# Patient Record
Sex: Female | Born: 1945
Health system: Southern US, Community
[De-identification: ages and names within clinical notes are randomized; demographics above are authoritative.]

## PROBLEM LIST (undated history)

## (undated) DIAGNOSIS — Z789 Other specified health status: Secondary | ICD-10-CM

## (undated) DIAGNOSIS — M810 Age-related osteoporosis without current pathological fracture: Secondary | ICD-10-CM

## (undated) DIAGNOSIS — Z603 Acculturation difficulty: Secondary | ICD-10-CM

## (undated) DIAGNOSIS — I10 Essential (primary) hypertension: Secondary | ICD-10-CM

## (undated) HISTORY — DX: Other specified health status: Z78.9

## (undated) HISTORY — DX: Essential (primary) hypertension: I10

## (undated) HISTORY — DX: Age-related osteoporosis without current pathological fracture: M81.0

## (undated) HISTORY — PX: COLONOSCOPY: SHX174

## (undated) HISTORY — DX: Acculturation difficulty: Z60.3

---

## 2005-03-18 ENCOUNTER — Encounter (INDEPENDENT_AMBULATORY_CARE_PROVIDER_SITE_OTHER): Payer: Self-pay | Admitting: *Deleted

## 2005-03-18 LAB — CONVERTED CEMR LAB

## 2005-03-22 ENCOUNTER — Ambulatory Visit: Payer: Self-pay | Admitting: Family Medicine

## 2005-04-05 ENCOUNTER — Encounter: Admission: RE | Admit: 2005-04-05 | Discharge: 2005-04-05 | Payer: Self-pay | Admitting: Sports Medicine

## 2005-05-03 ENCOUNTER — Ambulatory Visit (HOSPITAL_COMMUNITY): Admission: RE | Admit: 2005-05-03 | Discharge: 2005-05-03 | Payer: Self-pay | Admitting: Family Medicine

## 2005-05-03 ENCOUNTER — Ambulatory Visit: Payer: Self-pay | Admitting: Family Medicine

## 2005-08-02 ENCOUNTER — Inpatient Hospital Stay (HOSPITAL_COMMUNITY): Admission: RE | Admit: 2005-08-02 | Discharge: 2005-08-08 | Payer: Self-pay | Admitting: Psychiatry

## 2005-08-02 ENCOUNTER — Ambulatory Visit: Payer: Self-pay | Admitting: Family Medicine

## 2005-08-03 ENCOUNTER — Ambulatory Visit: Payer: Self-pay | Admitting: Psychiatry

## 2006-06-06 ENCOUNTER — Ambulatory Visit: Payer: Self-pay | Admitting: Family Medicine

## 2006-12-19 ENCOUNTER — Ambulatory Visit: Payer: Self-pay | Admitting: Family Medicine

## 2006-12-19 ENCOUNTER — Encounter: Payer: Self-pay | Admitting: Family Medicine

## 2006-12-29 ENCOUNTER — Ambulatory Visit: Payer: Self-pay | Admitting: Family Medicine

## 2007-01-15 DIAGNOSIS — I10 Essential (primary) hypertension: Secondary | ICD-10-CM

## 2007-01-15 DIAGNOSIS — E7800 Pure hypercholesterolemia, unspecified: Secondary | ICD-10-CM | POA: Insufficient documentation

## 2007-01-16 ENCOUNTER — Encounter (INDEPENDENT_AMBULATORY_CARE_PROVIDER_SITE_OTHER): Payer: Self-pay | Admitting: *Deleted

## 2007-06-08 ENCOUNTER — Ambulatory Visit: Payer: Self-pay | Admitting: Family Medicine

## 2007-06-19 ENCOUNTER — Encounter (INDEPENDENT_AMBULATORY_CARE_PROVIDER_SITE_OTHER): Payer: Self-pay | Admitting: Family Medicine

## 2007-06-19 ENCOUNTER — Encounter: Admission: RE | Admit: 2007-06-19 | Discharge: 2007-06-19 | Payer: Self-pay | Admitting: Family Medicine

## 2007-06-19 ENCOUNTER — Ambulatory Visit: Payer: Self-pay | Admitting: Family Medicine

## 2007-06-19 LAB — CONVERTED CEMR LAB
Basophils Absolute: 0 10*3/uL (ref 0.0–0.1)
Eosinophils Relative: 1 % (ref 0–5)
HCT: 42.7 % (ref 36.0–46.0)
MCHC: 33.5 g/dL (ref 30.0–36.0)
MCV: 93.8 fL (ref 78.0–100.0)
Neutro Abs: 3 10*3/uL (ref 1.7–7.7)
Neutrophils Relative %: 55 % (ref 43–77)
Platelets: 201 10*3/uL (ref 150–400)
RBC: 4.55 M/uL (ref 3.87–5.11)
RDW: 12.4 % (ref 11.5–14.0)
TSH: 0.435 microintl units/mL (ref 0.350–5.50)
WBC: 5.5 10*3/uL (ref 4.0–10.5)

## 2007-06-26 ENCOUNTER — Encounter: Payer: Self-pay | Admitting: Family Medicine

## 2007-07-21 ENCOUNTER — Encounter: Admission: RE | Admit: 2007-07-21 | Discharge: 2007-07-21 | Payer: Self-pay | Admitting: Family Medicine

## 2007-07-30 ENCOUNTER — Ambulatory Visit: Payer: Self-pay | Admitting: Family Medicine

## 2007-08-03 ENCOUNTER — Encounter: Payer: Self-pay | Admitting: Family Medicine

## 2007-08-27 ENCOUNTER — Ambulatory Visit: Payer: Self-pay | Admitting: Family Medicine

## 2008-04-13 ENCOUNTER — Encounter: Payer: Self-pay | Admitting: *Deleted

## 2009-07-07 ENCOUNTER — Ambulatory Visit: Payer: Self-pay | Admitting: Family Medicine

## 2009-07-07 ENCOUNTER — Encounter: Payer: Self-pay | Admitting: Family Medicine

## 2009-07-07 ENCOUNTER — Ambulatory Visit (HOSPITAL_COMMUNITY): Admission: RE | Admit: 2009-07-07 | Discharge: 2009-07-07 | Payer: Self-pay | Admitting: Family Medicine

## 2009-07-07 DIAGNOSIS — M25569 Pain in unspecified knee: Secondary | ICD-10-CM | POA: Insufficient documentation

## 2009-07-07 LAB — CONVERTED CEMR LAB
AST: 28 units/L (ref 0–37)
Albumin: 4.7 g/dL (ref 3.5–5.2)
Alkaline Phosphatase: 69 units/L (ref 39–117)
BUN: 17 mg/dL (ref 6–23)
Chloride: 104 meq/L (ref 96–112)
Glucose, Bld: 83 mg/dL (ref 70–99)
HDL: 57 mg/dL (ref >39)
LDL Cholesterol: 175 mg/dL — ABNORMAL HIGH (ref 0–99)
Potassium: 4.1 meq/L (ref 3.5–5.3)
VLDL: 34 mg/dL (ref 0–40)

## 2009-07-10 ENCOUNTER — Encounter: Payer: Self-pay | Admitting: Family Medicine

## 2009-08-18 ENCOUNTER — Ambulatory Visit: Payer: Self-pay | Admitting: Family Medicine

## 2009-08-18 ENCOUNTER — Encounter: Payer: Self-pay | Admitting: Family Medicine

## 2009-08-18 LAB — CONVERTED CEMR LAB
BUN: 11 mg/dL (ref 6–23)
CO2: 25 meq/L (ref 19–32)
Calcium: 9.4 mg/dL (ref 8.4–10.5)
Potassium: 4.1 meq/L (ref 3.5–5.3)
Sodium: 142 meq/L (ref 135–145)

## 2009-08-21 ENCOUNTER — Encounter: Payer: Self-pay | Admitting: Family Medicine

## 2009-12-11 ENCOUNTER — Encounter: Payer: Self-pay | Admitting: Family Medicine

## 2009-12-11 ENCOUNTER — Ambulatory Visit: Payer: Self-pay | Admitting: Family Medicine

## 2009-12-11 LAB — CONVERTED CEMR LAB
Glucose, Bld: 102 mg/dL — ABNORMAL HIGH (ref 70–99)
Sodium: 140 meq/L (ref 135–145)

## 2010-02-22 ENCOUNTER — Telehealth: Payer: Self-pay | Admitting: Family Medicine

## 2010-03-20 ENCOUNTER — Telehealth: Payer: Self-pay | Admitting: Family Medicine

## 2010-04-02 ENCOUNTER — Ambulatory Visit: Payer: Self-pay | Admitting: Family Medicine

## 2010-04-02 DIAGNOSIS — J309 Allergic rhinitis, unspecified: Secondary | ICD-10-CM | POA: Insufficient documentation

## 2010-08-16 ENCOUNTER — Ambulatory Visit: Payer: Self-pay | Admitting: Family Medicine

## 2010-08-16 DIAGNOSIS — F329 Major depressive disorder, single episode, unspecified: Secondary | ICD-10-CM | POA: Insufficient documentation

## 2010-08-16 DIAGNOSIS — F3289 Other specified depressive episodes: Secondary | ICD-10-CM | POA: Insufficient documentation

## 2010-09-14 ENCOUNTER — Ambulatory Visit: Payer: Self-pay | Admitting: Family Medicine

## 2010-09-14 DIAGNOSIS — R05 Cough: Secondary | ICD-10-CM | POA: Insufficient documentation

## 2010-12-19 NOTE — Assessment & Plan Note (Signed)
Summary: Itchy eyes   Vital Signs:  Patient profile:   65 year old female Height:      62 inches Weight:      125 pounds BMI:     22.95 BSA:     1.57 Temp:     98.2 degrees F Pulse rate:   113 / minute BP sitting:   149 / 90  Vitals Entered By: Jone Baseman CMA (Apr 02, 2010 11:17 AM) CC: Itchy eyes Is Patient Diabetic? No Pain Assessment Patient in pain? no        Primary Care Provider:  Romero Belling MD  CC:  Itchy eyes.  History of Present Illness: ITCY EYES.  1 month h/o bilateral itchy eyes with some watery discharge.  No visual changes or photophobia.  Taking OTC drops (Naphcon A) and Cetirizine without relief.  No cough, wheeze.  HLD / HTN.  No chest pain, dyspnea, LE edema.  Taking meds as prescribed.  Presented without interpreter.  History provided by caretaker.  Habits & Providers  Alcohol-Tobacco-Diet     Tobacco Status: never  Current Problems (verified): 1)  Allergic Rhinitis With Conjunctivitis  (ICD-477.9) 2)  Hypertension, Benign Systemic  (ICD-401.1) 3)  Hypercholesterolemia  (ICD-272.0) 4)  Knee Pain, Left  (ICD-719.46)  Current Medications (verified): 1)  Lisinopril 20 Mg Tabs (Lisinopril) .Marland Kitchen.. 1 Tab By Mouth Daily For High Blood Pressure 2)  Diclofenac Sodium 75 Mg Tbec (Diclofenac Sodium) .Marland Kitchen.. 1 Tab By Mouth Two Times A Day As Needed For Knee Pain 3)  Acetaminophen 500 Mg Tabs (Acetaminophen) .... 2 Tabs By Mouth Three Times A Day For Arthritis Pain 4)  Simvastatin 40 Mg Tabs (Simvastatin) .Marland Kitchen.. 1 Tab By Mouth Daily For High Cholesterol 5)  Patanol 0.1 % Soln (Olopatadine Hcl) .Marland Kitchen.. 1 Gtt Ou Two Times A Day For Allergic Conjunctivitis 6)  Cetirizine Hcl 10 Mg Tabs (Cetirizine Hcl) .Marland Kitchen.. 1 Tab By Mouth Daily For Allergies  Allergies (verified): No Known Drug Allergies  Social History: Moved to [place] in 03/2001 from [place]. Lives with her nephew's family (nephew, wife and daughter); No EtoH or tobacco  Unable to work 2/2 depression--now much  improved.  Husband died Aug 19, 2007.  Review of Systems       Per HPI.  Physical Exam  Additional Exam:  VITALS:  Reviewed, hypertensive GEN: Alert & oriented, no acute distress NECK: Midline trachea, no masses/thyromegaly, no cervical lymphadenopathy CARDIO: Regular rate and rhythm, no murmurs/rubs/gallops, 2+ bilateral radial pulses RESP: Clear to auscultation, normal work of breathing, no retractions/accessory muscle use EXT: Nontender, no edema EYES:  Mild conjunctival inflammation noted without discharge. EOMI. PERRLA.  Vision grossly normal. EARS:  External ear without significant lesions or deformities.  Clear canals, TM intact bilaterally without bulging, retraction, inflammation or discharge. Hearing grossly normal bilaterally. NOSE:  Nasal mucosa are pink and moist without lesions or exudates. MOUTH:  Oral mucosa and oropharynx without lesions or exudates.    Impression & Recommendations:  Problem # 1:  ALLERGIC RHINITIS WITH CONJUNCTIVITIS (ICD-477.9) Assessment New  Patanol and Cetirizine.  Orders: [facility]- Est  Level 4 ([address])  Problem # 2:  HYPERTENSION, BENIGN SYSTEMIC (ICD-401.1) Assessment: Deteriorated  Formerly well controlled--will not adjust medication today.  f/u 3 months. Her updated medication list for this problem includes:    Lisinopril 20 Mg Tabs (Lisinopril) .Marland Kitchen... 1 tab by mouth daily for high blood pressure  BP today: 149/90 Prior BP: 126/86 (12/11/2009)  Labs Reviewed: K+: 4.2 (12/11/2009) Creat: : 0.85 (12/11/2009)  Chol: 266 (07/07/2009)   HDL: 57 (07/07/2009)   LDL: 175 (07/07/2009)   TG: 170 (07/07/2009)  Orders: [facility]- Est  Level 4 ([address])  Problem # 3:  HYPERCHOLESTEROLEMIA (ICD-272.0) Assessment: Unchanged  Direct LDL 108 (12/11/2009) Her updated medication list for this problem includes:    Simvastatin 40 Mg Tabs (Simvastatin) .Marland Kitchen... 1 tab by mouth daily for high cholesterol  Orders: [facility]- Est  Level 4 ([address])  Complete Medication  List: 1)  Lisinopril 20 Mg Tabs (Lisinopril) .Marland Kitchen.. 1 tab by mouth daily for high blood pressure 2)  Diclofenac Sodium 75 Mg Tbec (Diclofenac sodium) .Marland Kitchen.. 1 tab by mouth two times a day as needed for knee pain 3)  Acetaminophen 500 Mg Tabs (Acetaminophen) .... 2 tabs by mouth three times a day for arthritis pain 4)  Simvastatin 40 Mg Tabs (Simvastatin) .Marland Kitchen.. 1 tab by mouth daily for high cholesterol 5)  Patanol 0.1 % Soln (Olopatadine hcl) .Marland Kitchen.. 1 gtt ou two times a day for allergic conjunctivitis 6)  Cetirizine Hcl 10 Mg Tabs (Cetirizine hcl) .Marland Kitchen.. 1 tab by mouth daily for allergies  Patient Instructions: 1)  Please schedule a follow-up appointment in 3 months. 2)  Use Patanol drops two times a day for itchy eyes.  Continue to use the Cetirizine. 3)  I have refilled your other medications. Prescriptions: CETIRIZINE HCL 10 MG TABS (CETIRIZINE HCL) 1 tab by mouth daily for allergies  #30 x 3   Entered and Authorized by:   Romero Belling MD   Signed by:   Romero Belling MD on 04/02/2010   Method used:   Electronically to        [facility] [phone]* (retail)       [address]       [place], [place]  [address]       Ph: [phone]       Fax: [phone]   RxID:   [id number] SIMVASTATIN 40 MG TABS (SIMVASTATIN) 1 tab by mouth daily for high cholesterol  #30 x 3   Entered and Authorized by:   Romero Belling MD   Signed by:   Romero Belling MD on 04/02/2010   Method used:   Electronically to        [facility] [address] [phone]* (retail)       [address]       [place], [place]  [address]       Ph: [phone]       Fax: [phone]   RxID:   [id number] ACETAMINOPHEN 500 MG TABS (ACETAMINOPHEN) 2 tabs by mouth three times a day for arthritis pain  #180 x 3   Entered and Authorized by:   Romero Belling MD   Signed by:   Romero Belling MD on 04/02/2010   Method used:   Electronically to        [facility] [address] [phone]* (retail)       [address]       [place], [place]   [address]       Ph: [phone]       Fax: [phone]   RxID:   [id number] DICLOFENAC SODIUM 75 MG TBEC (DICLOFENAC SODIUM) 1 tab by mouth two times a day as needed for knee pain  #60 x 3   Entered and Authorized by:   Romero Belling MD   Signed by:   Romero Belling MD on 04/02/2010   Method used:   Electronically to  [facility]  [address] [phone]* (retail)       [address]       [place], [place]  [address]       Ph: [phone]       Fax: [phone]   RxID:   [id number] LISINOPRIL 20 MG TABS (LISINOPRIL) 1 tab by mouth daily for high blood pressure  #30 x 3   Entered and Authorized by:   Romero Belling MD   Signed by:   Romero Belling MD on 04/02/2010   Method used:   Electronically to        [facility] [address] [phone]* (retail)       [address]       [place], [place]  [address]       Ph: [phone]       Fax: [phone]   RxID:   [id number] PATANOL 0.1 % SOLN (OLOPATADINE HCL) 1 gtt ou two times a day for allergic conjunctivitis  #1 x 3   Entered and Authorized by:   Romero Belling MD   Signed by:   Romero Belling MD on 04/02/2010   Method used:   Electronically to        [facility] [address] [phone]* (retail)       [address]       [place], [place]  [address]       Ph: [phone]       Fax: [phone]   RxID:   [id number]

## 2010-12-19 NOTE — Assessment & Plan Note (Signed)
Summary: f/up,tcb   Vital Signs:  Patient profile:   65 year old female Weight:      125.3 pounds Temp:     98 degrees F oral Pulse rate:   81 / minute BP sitting:   126 / 86  (right arm) Cuff size:   regular  Vitals Entered By: Loralee Pacas CMA (December 11, 2009 4:09 PM)  Primary Care Provider:  Romero Belling MD   History of Present Illness: LEFT KNEE PAIN.  Improved.  Using cane, taking Diclofenac, using knee brace.  HTN.  No chest pain, dyspnea, LE edema.  Taking meds as prescribed.  HLD.  Taking meds as prescribed.  Presented without interpreter.  History provided by caretaker.  Current Medications (verified): 1)  Lisinopril 20 Mg Tabs (Lisinopril) .Marland Kitchen.. 1 Tab By Mouth Daily For High Blood Pressure 2)  Diclofenac Sodium 75 Mg Tbec (Diclofenac Sodium) .Marland Kitchen.. 1 Tab By Mouth Two Times A Day As Needed For Knee Pain 3)  Acetaminophen 500 Mg Tabs (Acetaminophen) .... 2 Tabs By Mouth Three Times A Day For Arthritis Pain 4)  Simvastatin 40 Mg Tabs (Simvastatin) .Marland Kitchen.. 1 Tab By Mouth Daily For High Cholesterol  Allergies: No Known Drug Allergies   Impression & Recommendations:  Problem # 1:  KNEE PAIN, LEFT (ICD-719.46) Assessment Improved  Her updated medication list for this problem includes:    Diclofenac Sodium 75 Mg Tbec (Diclofenac sodium) .Marland Kitchen... 1 tab by mouth two times a day as needed for knee pain    Acetaminophen 500 Mg Tabs (Acetaminophen) .Marland Kitchen... 2 tabs by mouth three times a day for arthritis pain  Orders: [facility]- Est  Level 4 ([address])  Problem # 2:  HYPERTENSION, BENIGN SYSTEMIC (ICD-401.1) Assessment: Improved  Her updated medication list for this problem includes:    Lisinopril 20 Mg Tabs (Lisinopril) .Marland Kitchen... 1 tab by mouth daily for high blood pressure  Orders: [facility]- Est  Level 4 ([address]) Basic Met-[facility] ([address])  BP today: 126/86 Prior BP: 143/86 (08/18/2009)  Labs Reviewed: K+: 4.1 (08/18/2009) Creat: : 0.78 (08/18/2009)   Chol: 266 (07/07/2009)   HDL:  57 (07/07/2009)   LDL: 175 (07/07/2009)   TG: 170 (07/07/2009)  Problem # 3:  HYPERCHOLESTEROLEMIA (ICD-272.0) Assessment: Unchanged  Her updated medication list for this problem includes:    Simvastatin 40 Mg Tabs (Simvastatin) .Marland Kitchen... 1 tab by mouth daily for high cholesterol  Orders: [facility]- Est  Level 4 ([address]) Direct LDL-[facility] ([address])  Labs Reviewed: SGOT: 28 (07/07/2009)   SGPT: 32 (07/07/2009)   HDL:57 (07/07/2009)  LDL:175 (07/07/2009)  Chol:266 (07/07/2009)  Trig:170 (07/07/2009)  Problem # 4:  Screening Breast Cancer (ICD-V76.10) Assessment: Comment Only Given mammo info to schedule mammogram  Complete Medication List: 1)  Lisinopril 20 Mg Tabs (Lisinopril) .Marland Kitchen.. 1 tab by mouth daily for high blood pressure 2)  Diclofenac Sodium 75 Mg Tbec (Diclofenac sodium) .Marland Kitchen.. 1 tab by mouth two times a day as needed for knee pain 3)  Acetaminophen 500 Mg Tabs (Acetaminophen) .... 2 tabs by mouth three times a day for arthritis pain 4)  Simvastatin 40 Mg Tabs (Simvastatin) .Marland Kitchen.. 1 tab by mouth daily for high cholesterol  Patient Instructions: 1)  I've refilled your pain medicine--try not to take this every day. 2)  Please bring interpreter to your next visit--VERY important. 3)  Please schedule a follow-up appointment in 3 months .  Prescriptions: DICLOFENAC SODIUM 75 MG TBEC (DICLOFENAC SODIUM) 1 tab by mouth two times a day as needed for knee pain  #  60 x 0   Entered and Authorized by:   Romero Belling MD   Signed by:   Romero Belling MD on 12/11/2009   Method used:   Print then Give to Patient   RxID:   [id number]    Prevention & Chronic Care Immunizations   Influenza vaccine: Fluvax 3+  (08/18/2009)    Tetanus booster: Not documented    Pneumococcal vaccine: Not documented    H. zoster vaccine: Not documented  Colorectal Screening   Hemoccult: Not documented    Colonoscopy: Not documented  Other Screening   Pap smear: Done.  (03/18/2005)    Mammogram: Not  documented    DXA bone density scan: Not documented   Smoking status: never  (08/18/2009)  Lipids   Total Cholesterol: 266  (07/07/2009)   LDL: 175  (07/07/2009)   LDL Direct: Not documented   HDL: 57  (07/07/2009)   Triglycerides: 170  (07/07/2009)    SGOT (AST): 28  (07/07/2009)   SGPT (ALT): 32  (07/07/2009)   Alkaline phosphatase: 69  (07/07/2009)   Total bilirubin: 0.4  (07/07/2009)    Lipid flowsheet reviewed?: Yes   Progress toward LDL goal: Unchanged  Hypertension   Last Blood Pressure: 126 / 86  (12/11/2009)   Serum creatinine: 0.78  (08/18/2009)   Serum potassium 4.1  (08/18/2009)    Hypertension flowsheet reviewed?: Yes   Progress toward BP goal: At goal  Self-Management Support :    Hypertension self-management support: Not documented    Lipid self-management support: Not documented     Appended Document: f/up,tcb    Clinical Lists Changes  Observations: Added new observation of PERSLDLGOAL: 130  (12/11/2009 23:43) Added new observation of PERSBPGOAL: 140/90  (12/11/2009 23:43) Added new observation of HTN PROGRESS: At goal  (12/11/2009 23:43) Added new observation of HTN FSREVIEW: Yes  (12/11/2009 23:43) Added new observation of LIPID PROGRS: At goal  (12/11/2009 23:43) Added new observation of LIPID FSREVW: Yes  (12/11/2009 23:43) Added new observation of DM PROGRESS: N/A  (12/11/2009 23:43) Added new observation of DM FSREVIEW: N/A  (12/11/2009 23:43)       Prevention & Chronic Care Immunizations   Influenza vaccine: Fluvax 3+  (08/18/2009)    Tetanus booster: Not documented    Pneumococcal vaccine: Not documented    H. zoster vaccine: Not documented  Colorectal Screening   Hemoccult: Not documented    Colonoscopy: Not documented  Other Screening   Pap smear: Done.  (03/18/2005)    Mammogram: Not documented    DXA bone density scan: Not documented   Smoking status: never  (08/18/2009)  Lipids   Total Cholesterol: 266   (07/07/2009)   LDL: 175  (07/07/2009)   LDL Direct: 108  (12/11/2009)   HDL: 57  (07/07/2009)   Triglycerides: 170  (07/07/2009)    SGOT (AST): 28  (07/07/2009)   SGPT (ALT): 32  (07/07/2009)   Alkaline phosphatase: 69  (07/07/2009)   Total bilirubin: 0.4  (07/07/2009)    Lipid flowsheet reviewed?: Yes   Progress toward LDL goal: At goal  Hypertension   Last Blood Pressure: 126 / 86  (12/11/2009)   Serum creatinine: 0.85  (12/11/2009)   Serum potassium 4.2  (12/11/2009)    Hypertension flowsheet reviewed?: Yes   Progress toward BP goal: At goal  Self-Management Support :   Personal Goals (by the next clinic visit) :      Personal blood pressure goal: 140/90  (12/11/2009)     Personal LDL goal: 130  (  12/11/2009)    Hypertension self-management support: Not documented    Lipid self-management support: Not documented     Appended Document: f/up,tcb    Clinical Lists Changes  Observations: Added new observation of HTN PROGRESS: At goal (12/12/2009 0:07) Added new observation of HTN FSREVIEW: Yes (12/12/2009 0:07) Added new observation of LIPID PROGRS: At goal (12/12/2009 0:07) Added new observation of LIPID FSREVW: Yes (12/12/2009 0:07) Added new observation of DM PROGRESS: N/A (12/12/2009 0:07) Added new observation of DM FSREVIEW: N/A (12/12/2009 0:07)       Prevention & Chronic Care Immunizations   Influenza vaccine: Fluvax 3+  (08/18/2009)    Tetanus booster: Not documented    Pneumococcal vaccine: Not documented    H. zoster vaccine: Not documented  Colorectal Screening   Hemoccult: Not documented    Colonoscopy: Not documented  Other Screening   Pap smear: Done.  (03/18/2005)    Mammogram: Not documented    DXA bone density scan: Not documented   Smoking status: never  (08/18/2009)  Lipids   Total Cholesterol: 266  (07/07/2009)   LDL: 175  (07/07/2009)   LDL Direct: 108  (12/11/2009)   HDL: 57  (07/07/2009)   Triglycerides: 170   (07/07/2009)    SGOT (AST): 28  (07/07/2009)   SGPT (ALT): 32  (07/07/2009)   Alkaline phosphatase: 69  (07/07/2009)   Total bilirubin: 0.4  (07/07/2009)    Lipid flowsheet reviewed?: Yes   Progress toward LDL goal: At goal  Hypertension   Last Blood Pressure: 126 / 86  (12/11/2009)   Serum creatinine: 0.85  (12/11/2009)   Serum potassium 4.2  (12/11/2009)    Hypertension flowsheet reviewed?: Yes   Progress toward BP goal: At goal  Self-Management Support :   Personal Goals (by the next clinic visit) :      Personal blood pressure goal: 140/90  (12/11/2009)     Personal LDL goal: 130  (12/11/2009)    Hypertension self-management support: Not documented    Lipid self-management support: Not documented

## 2010-12-19 NOTE — Progress Notes (Signed)
Summary: RX  Phone Note Refill Request Call back at [phone]   Refills Requested: Medication #1:  LISINOPRIL 20 MG TABS 1 tab by mouth daily for high blood pressure pt [facility] to [facility] [address]  Initial call taken by: Knox Royalty,  February 22, 2010 3:36 PM  Follow-up for Phone Call        Cannot find this [facility], please get an address or phone number for the pharmacy.  Thanks. Follow-up by: Romero Belling MD,  February 22, 2010 3:52 PM    Prescriptions: LISINOPRIL 20 MG TABS (LISINOPRIL) 1 tab by mouth daily for high blood pressure  #30 x 6   Entered and Authorized by:   Romero Belling MD   Signed by:   Romero Belling MD on 02/23/2010   Method used:   Electronically to        [facility] [phone]* (retail)       [address]       [place], [place]  [address]       Ph: [phone]       Fax: [phone]   RxID:   [id number]

## 2010-12-19 NOTE — Assessment & Plan Note (Signed)
Summary: depression,tcb   Vital Signs:  Patient profile:   65 year old female Height:      62 inches Weight:      114.8 pounds BMI:     21.07 Temp:     98.8 degrees F oral Pulse rate:   70 / minute BP sitting:   146 / 91  (left arm) Cuff size:   regular  Vitals Entered By: Garen Grams LPN (August 16, 2010 11:12 AM) CC: family believes she may be depressed Is Patient Diabetic? No Pain Assessment Patient in pain? no        Primary Care Provider:  Romero Belling MD  CC:  family believes she may be depressed.  History of Present Illness: Pt accompanied by family friend.  History provided by friend and by daughter via telephone as interpretor.  1. Depression:  Family is concerned that she has depression.  She has a hx of depression in the past but has not been on any anti-depressant medications for over a year.  She has had decreased mood for the past couple of months.  She endorses feeling unhappy and just "wants a shot to make me happy".  ROS: She sleeps a lot.  She is also reclusive and doesn't want to talk to anybody.  Endorses decreased energy  PMHx: Has been to [facility] before  2. HTN:  Pt is taking and tolerating her medicines as prescribed.  She doesn't check her blood pressure at home regularly.   Habits & Providers  Alcohol-Tobacco-Diet     Tobacco Status: never  Allergies: No Known Drug Allergies  Social History: Reviewed history from 04/02/2010 and no changes required. Moved to [place] in 03/2001 from [place]. Lives with her nephew's family (nephew, wife and daughter); No EtoH or tobacco  Unable to work 2/2 depression.  Husband died 07-Aug-2007.  Review of Systems       Unable to review because of language barrier  Physical Exam  General:  vitals reviewed and rechecked.  no acute distress Head:  normocephalic and atraumatic.   Eyes:  normal conjunctiva Lungs:  Normal respiratory effort Heart:  normal rate and regular rhythm.   Extremities:  no  lower extremity edema Neurologic:  gait normal.   Psych:  flat affect.  Will spontaneously smile.   Impression & Recommendations:  Problem # 1:  DEPRESSION (ICD-311) Assessment New  Unable to fully assess given language barrier and no family present to interpret.  Will start trial of Paxil to see if that helps.  Will see her back in 4 weeks to reassess.  Advised her to bring family with her to help with encounter. Her updated medication list for this problem includes:    Paxil 20 Mg Tabs (Paroxetine hcl) .Marland Kitchen... 1 tab by mouth daily for depression  Orders: [facility]- Est  Level 4 ([address])  Problem # 2:  HYPERTENSION, BENIGN SYSTEMIC (ICD-401.1) Assessment: Unchanged  Not at goal.  Will increase Lisinopril to 40 mg daily.  Will have pt back in 4 weeks to recheck.  Will check BMET at that point. Her updated medication list for this problem includes:    Lisinopril 40 Mg Tabs (Lisinopril) .Marland Kitchen... 1 tab by mouth daily for blood pressure  Orders: [facility]- Est  Level 4 ([address])  Complete Medication List: 1)  Lisinopril 40 Mg Tabs (Lisinopril) .Marland Kitchen.. 1 tab by mouth daily for blood pressure 2)  Diclofenac Sodium 75 Mg Tbec (Diclofenac sodium) .Marland Kitchen.. 1 tab by mouth two times a day as needed for  knee pain 3)  Acetaminophen 500 Mg Tabs (Acetaminophen) .... 2 tabs by mouth three times a day for arthritis pain 4)  Simvastatin 40 Mg Tabs (Simvastatin) .Marland Kitchen.. 1 tab by mouth daily for high cholesterol 5)  Patanol 0.1 % Soln (Olopatadine hcl) .Marland Kitchen.. 1 gtt ou two times a day for allergic conjunctivitis 6)  Cetirizine Hcl 10 Mg Tabs (Cetirizine hcl) .Marland Kitchen.. 1 tab by mouth daily for allergies 7)  Paxil 20 Mg Tabs (Paroxetine hcl) .Marland Kitchen.. 1 tab by mouth daily for depression  Other Orders: Influenza Vaccine MCR (04540)  Patient Instructions: 1)  It was nice to meet you today 2)  We will try Paxil for the depression 3)  We are also going to increase the Lisinopril to 40 mg a day for her blood pressure 4)  Please schedule a  follow up appointment in 4 weeks to see how your depression and blood pressure are. 5)  Please bring family with you to your appointments Prescriptions: LISINOPRIL 40 MG TABS (LISINOPRIL) 1 tab by mouth daily for blood pressure  #30 x 3   Entered and Authorized by:   Angelena Sole MD   Signed by:   Angelena Sole MD on 08/16/2010   Method used:   Electronically to        [facility] [phone]* (retail)       [address]       [place], [place]  [address]       Ph: [phone]       Fax: [phone]   RxID:   [id number] PAXIL 20 MG TABS (PAROXETINE HCL) 1 tab by mouth daily for depression  #30 x 3   Entered and Authorized by:   Angelena Sole MD   Signed by:   Angelena Sole MD on 08/16/2010   Method used:   Electronically to        [facility] [phone]* (retail)       [address]       [place], [place]  [address]       Ph: [phone]       Fax: [phone]   RxID:   [id number]   Prevention & Chronic Care Immunizations   Influenza vaccine: Fluvax MCR  (08/16/2010)    Tetanus booster: Not documented    Pneumococcal vaccine: Not documented    H. zoster vaccine: Not documented  Colorectal Screening   Hemoccult: Not documented    Colonoscopy: Not documented  Other Screening   Pap smear: Done.  (03/18/2005)    Mammogram: Not documented    DXA bone density scan: Not documented   Smoking status: never  (08/16/2010)  Lipids   Total Cholesterol: 266  (07/07/2009)   LDL: 175  (07/07/2009)   LDL Direct: 108  (12/11/2009)   HDL: 57  (07/07/2009)   Triglycerides: 170  (07/07/2009)    SGOT (AST): 28  (07/07/2009)   SGPT (ALT): 32  (07/07/2009)   Alkaline phosphatase: 69  (07/07/2009)   Total bilirubin: 0.4  (07/07/2009)    Lipid flowsheet reviewed?: Yes   Progress toward LDL goal: Unchanged  Hypertension   Last Blood Pressure: 146 / 91  (08/16/2010)   Serum creatinine: 0.85  (12/11/2009)   Serum potassium 4.2  (12/11/2009)     Hypertension flowsheet reviewed?: Yes   Progress toward BP goal: Unchanged  Self-Management Support :   Personal Goals (by the next clinic visit) :      Personal blood pressure goal: 140/90  (12/11/2009)  Personal LDL goal: 130  (12/11/2009)    Hypertension self-management support: Not documented    Lipid self-management support: Not documented    Nursing Instructions: Give Flu vaccine today    Immunizations Administered:  Influenza Vaccine # 1:    Vaccine Type: Fluvax MCR    Site: left deltoid    Mfr: [employer]oSmith[employer]    Dose: 0.5 ml    Route: IM    Given by: Jone Baseman CMA    Exp. Date: 05/15/2011    Lot #: [id number]    VIS given: 06/12/10 version given August 16, 2010.  Flu Vaccine Consent Questions:    Do you have a history of severe allergic reactions to this vaccine? no    Any prior history of allergic reactions to egg and/or gelatin? no    Do you have a sensitivity to the preservative Thimersol? no    Do you have a past history of Guillan-Barre Syndrome? no    Do you currently have an acute febrile illness? no    Have you ever had a severe reaction to latex? no    Vaccine information given and explained to patient? yes    Are you currently pregnant? no

## 2010-12-19 NOTE — Assessment & Plan Note (Signed)
Summary: F/U  DEPRESSION/KH   Vital Signs:  Patient profile:   65 year old female Height:      62 inches Weight:      116 pounds Temp:     98.0 degrees F Pulse rate:   80 / minute Resp:     16 per minute BP sitting:   130 / 86  Primary Care Provider:  Romero Belling MD   History of Present Illness: 1. Depression: It is a little bit better even though she hasn't been taking the Paxil.  She doesn't like the Paxil because she doesn't think that she needs it.  She also hasn't been sleeping as well since taking it.  ROS: denies SI  2. Knee pain:  Taking the Diclofenac once a day for the knee pain.  Not taking any tylenol.  Bilateral knee pain.  No new injuries.  ROS: denies locking or catching or giving out  3. Cough: Has has a cough over the past couple of days.    ROS: denies shortness of breath, fevers, runny nose, sore throat  Current Medications (verified): 1)  Lisinopril 40 Mg Tabs (Lisinopril) .Marland Kitchen.. 1 Tab By Mouth Daily For Blood Pressure 2)  Diclofenac Sodium 75 Mg Tbec (Diclofenac Sodium) .Marland Kitchen.. 1 Tab By Mouth Two Times A Day As Needed For Knee Pain 3)  Acetaminophen 500 Mg Tabs (Acetaminophen) .... 2 Tabs By Mouth Three Times A Day For Arthritis Pain 4)  Simvastatin 40 Mg Tabs (Simvastatin) .Marland Kitchen.. 1 Tab By Mouth Daily For High Cholesterol 5)  Patanol 0.1 % Soln (Olopatadine Hcl) .Marland Kitchen.. 1 Gtt Ou Two Times A Day For Allergic Conjunctivitis  Allergies: No Known Drug Allergies  Social History: Reviewed history from 08/16/2010 and no changes required. Moved to [place] in 03/2001 from [place]. Lives with her nephew's family (nephew, wife and daughter); No EtoH or tobacco  Unable to work 2/2 depression.  Husband died 08-17-07.  Physical Exam  General:  vitals reviewed and rechecked.  no acute distress Eyes:  normal conjunctiva Nose:  no nasal discharge.   Mouth:  pharynx pink and moist.   Neck:  supple and no masses.   Lungs:  Normal respiratory effort.  Lungs CTAB.  No consolidation,  wheezing, or crackles.  No coughing during entire encounter. Heart:  normal rate and regular rhythm.   Msk:  Bilateral knees:  full ROM.  + crepitus with movement.  TTP along joint lines.  No effusion, warmth, or swelling. Extremities:  no lower extremity edema Neurologic:  cranial nerves II-XII intact and gait normal.   Psych:  flat affect.     Impression & Recommendations:  Problem # 1:  DEPRESSION (ICD-311) Assessment Unchanged  Pt does not want any medications for her depression.  Will stop the Paxil. The following medications were removed from the medication list:    Paxil 20 Mg Tabs (Paroxetine hcl) .Marland Kitchen... 1 tab by mouth daily for depression  Orders: [facility]- Est  Level 4 (47829)  Problem # 2:  KNEE PAIN, BILATERAL (ICD-719.46) Assessment: Deteriorated  Advised Tylenol twice a day everyday and Diclofenac twice a day as needed for pain. Her updated medication list for this problem includes:    Diclofenac Sodium 75 Mg Tbec (Diclofenac sodium) .Marland Kitchen... 1 tab by mouth two times a day as needed for knee pain    Acetaminophen 500 Mg Tabs (Acetaminophen) .Marland Kitchen... 2 tabs by mouth three times a day for arthritis pain  Orders: [facility]- Est  Level 4 (56213)  Problem # 3:  COUGH (ICD-786.2) Assessment: New  No signs of serious infection.  Possible viral uri.  Supportive care only.  Orders: [facility]- Est  Level 4 ([address])  Complete Medication List: 1)  Lisinopril 40 Mg Tabs (Lisinopril) .Marland Kitchen.. 1 tab by mouth daily for blood pressure 2)  Diclofenac Sodium 75 Mg Tbec (Diclofenac sodium) .Marland Kitchen.. 1 tab by mouth two times a day as needed for knee pain 3)  Acetaminophen 500 Mg Tabs (Acetaminophen) .... 2 tabs by mouth three times a day for arthritis pain 4)  Simvastatin 40 Mg Tabs (Simvastatin) .Marland Kitchen.. 1 tab by mouth daily for high cholesterol 5)  Patanol 0.1 % Soln (Olopatadine hcl) .Marland Kitchen.. 1 gtt ou two times a day for allergic conjunctivitis  Patient Instructions: 1)  Since you didn't respond to the Paxil well  we can stop that 2)  You should be taking Tylenol 500mg  twice a day and the Diclofenac on top of that if you need to twice a day 3)  I have sent in a refill for your other medicines 4)  Please schedule a follow up in 3 months Prescriptions: ACETAMINOPHEN 500 MG TABS (ACETAMINOPHEN) 2 tabs by mouth three times a day for arthritis pain  #180 x 3   Entered and Authorized by:   Angelena Sole MD   Signed by:   Angelena Sole MD on 09/14/2010   Method used:   Electronically to        [facility] [address] [phone]* (retail)       [address]       [place], [place]  [address]       Ph: [phone]       Fax: [phone]   RxID:   [id number] SIMVASTATIN 40 MG TABS (SIMVASTATIN) 1 tab by mouth daily for high cholesterol  #30 x 3   Entered and Authorized by:   Angelena Sole MD   Signed by:   Angelena Sole MD on 09/14/2010   Method used:   Electronically to        [facility] [address] [phone]* (retail)       [address]       [place], [place]  [address]       Ph: [phone]       Fax: [phone]   RxID:   [id number] DICLOFENAC SODIUM 75 MG TBEC (DICLOFENAC SODIUM) 1 tab by mouth two times a day as needed for knee pain  #60 x 3   Entered and Authorized by:   Angelena Sole MD   Signed by:   Angelena Sole MD on 09/14/2010   Method used:   Electronically to        [facility] [address] [phone]* (retail)       [address]       [place], [place]  [address]       Ph: [phone]       Fax: [phone]   RxID:   [id number] LISINOPRIL 40 MG TABS (LISINOPRIL) 1 tab by mouth daily for blood pressure  #30 x 3   Entered and Authorized by:   Angelena Sole MD   Signed by:   Angelena Sole MD on 09/14/2010   Method used:   Electronically to        [facility] [address] [phone]* (retail)       [address]       [place], [place]  [address]       Ph: [phone]       Fax:  [phone]   RxID:   [id number]    Orders Added: 1)  [facility]- Est  Level 4 [01601]

## 2010-12-19 NOTE — Letter (Signed)
Summary: LDL at goal  [facility]  [address]   [place], [place] [address]   Phone: [phone]  Fax: [phone]    12/11/2009  LINNEA TODISCO [address] [place], [place]  [address]  Dear Ms. Berle Mull,  I have reviewed your LDL cholesterol from today.  It is now down to 108, and your goal is <130.  I am pleased that you are now at goal.  Please continue to take your Simvastatin.  Sincerely, Romero Belling MD  Appended Document: LDL at goal mailed.

## 2010-12-19 NOTE — Progress Notes (Signed)
Summary: Rx Req  Phone Note Refill Request Call back at [phone]   Refills Requested: Medication #1:  LISINOPRIL 20 MG TABS 1 tab by mouth daily for high blood pressure  Medication #2:  SIMVASTATIN 40 MG TABS 1 tab by mouth daily for high cholesterol.  Medication #3:  ACETAMINOPHEN 500 MG TABS 2 tabs by mouth three times a day for arthritis pain PT USES [facility] [address]  Initial call taken by: Clydell Hakim,  Mar 20, 2010 2:15 PM    Prescriptions: SIMVASTATIN 40 MG TABS (SIMVASTATIN) 1 tab by mouth daily for high cholesterol  #30 x 3   Entered and Authorized by:   Romero Belling MD   Signed by:   Romero Belling MD on 03/20/2010   Method used:   Electronically to        [facility] [phone]* (retail)       [address]       [place], [place]  [address]       Ph: [phone]       Fax: [phone]   RxID:   [id number] ACETAMINOPHEN 500 MG TABS (ACETAMINOPHEN) 2 tabs by mouth three times a day for arthritis pain  #180 x 2   Entered and Authorized by:   Romero Belling MD   Signed by:   Romero Belling MD on 03/20/2010   Method used:   Electronically to        [facility] [address] [phone]* (retail)       [address]       [place], [place]  [address]       Ph: [phone]       Fax: [phone]   RxID:   [id number] LISINOPRIL 20 MG TABS (LISINOPRIL) 1 tab by mouth daily for high blood pressure  #30 x 3   Entered and Authorized by:   Romero Belling MD   Signed by:   Romero Belling MD on 03/20/2010   Method used:   Electronically to        [facility] [address] [phone]* (retail)       [address]       [place], [place]  [address]       Ph: [phone]       Fax: [phone]   RxID:   [id number]

## 2011-01-15 ENCOUNTER — Other Ambulatory Visit: Payer: Self-pay | Admitting: Family Medicine

## 2011-01-15 NOTE — Telephone Encounter (Signed)
Please review and refill

## 2011-01-20 ENCOUNTER — Other Ambulatory Visit: Payer: Self-pay | Admitting: Family Medicine

## 2011-01-21 NOTE — Telephone Encounter (Signed)
Refill request
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
## 2011-04-05 NOTE — Discharge Summary (Signed)
Abigail Riley, SIMMERING NO.:  [id number]   MEDICAL RECORD NO.:  [id number]          PATIENT TYPE:  IPS   LOCATION:  0402                          FACILITY:  [facility]   PHYSICIAN:  Geoffery Lyons, M.D.      DATE OF BIRTH:  04/09/1946   DATE OF ADMISSION:  08/02/2005  DATE OF DISCHARGE:  08/08/2005                                 DISCHARGE SUMMARY   CHIEF COMPLAINT AND PRESENTING ILLNESS:  This was the first admission to  [facility]  for this 65 year old married [place]  female.  She presented to the emergency room early in the afternoon with her  son.  She was weak, pale and exhausted.  Reporting increased depression over  the past one and a half months.  She came with [place]  [place] ago.  In [place] she was making great money and she has had very few  opportunities in [place].  Her husband is not helpful as he used to.  He is  status post CVA.  She has endorsed having fleeting suicidal ideas, no intent  to follow through.  Gets suicidal when she feels helpless but loves her  family and would never do that.   PAST PSYCHIATRIC HISTORY:  No inpatient.  She was being take care of by her  primary care physician back in [place].  She was apparently on Prozac and  Depakote.   PAST MEDICAL HISTORY:  Hypertension, hypercholesterolemia.   MEDICATIONS:  Accupril 20 mg per day, Zocor 40 mg per day, __________  375  at bedtime.   PHYSICAL EXAMINATION:  Performed, failed to show any acute findings.   LABORATORY WORKUP:  Blood chemistries:  Sodium 142, potassium 4.0, glucose  145, SGOT 19, SGPT 16.  __________  was 135.8.  TSH 1.399.   MENTAL STATUS EXAM:  Reveals an alert female, very limited English, mostly  saying okay, okay, okay.  Somewhat disheveled, hair needs to be combed,  but she is appropriately dressed.  Mood was depressed and anxious.  Affect  was appropriate to the situation.  Thought processes were logical, coherent  and  relevant, a lot worries, a lot of concern about financial situation,  what was going to happen to them.  But no evidence of active delusions, no  hallucinations.  Cognition well preserved.   ADMISSION DIAGNOSES:  AXIS I:  Major depression, rule out bipolar disorder  depressed.  AXIS II:  No diagnosis.  AXIS III:  Arterial hypertension, hypercholesterolemia, rule out urinary  tract infection.  AXIS IV:  Moderate.  AXIS V:  Upon admission 35-40, highest global assessment of function in the  last year 60.   COURSE IN HOSPITAL:  She  was admitted and started in individual and group  psychotherapy. She was maintained on amitriptyline 25 at night, maintained  on Accupril 20 mg daily and Zocor 20 mg per day.  She was given Zyprexa 5  twice a day and she was given Cipro 250 twice a day for urinary tract  infection.  Cipro was eventually discontinued.  She was placed back on  Prozac 10 mg per day, Zyprexa was placed on a p.r.n. basis.  She was given  Ambien 12.5 at bedtime, and she was given some Ativan as needed for anxiety.  Zyprexa was placed at 5 mg at night and then increased to 10.  She indeed  had a past history of mental disorder, was on Prozac and Depakote.  [place] in [place] last [place], multiple stressors, husband very ill, got very  upset, overwhelmed, endorsed suicidal ideas, inpatient was recommended.  Sleep was an issue.  As she slept better in the unit, she started feeling  better.  She had been observed in the unit looking suspiciously up and down  the hall and talking to herself.  She did endorse that she could not sleep  as she worried a lot about her financial status.  Her husband has been  unable to work.  Stays anxious.  In the house.  Makes her feel like hurting  herself.  She was worried, ruminations, feeling overwhelmed.  Not able to  say that she is going to be safe.  In the next few days, we continued to  stabilize with regards to the medications.  Sleep improved.   She was still  worrying, a lot of concern, but she was able to be more hopeful.  Was  willing to continue taking medication.  On September 21 she was overall  better.  Still worried but she was made aware of resources that she could  use, so she was more hopeful.  Endorsed no suicidal or homicidal ideas, and  willingness to continue outpatient treatment.   DISCHARGE DIAGNOSES:  AXIS I:  Rule out bipolar disorder, depressed with  psychotic features, anxiety disorder not otherwise specified.  AXIS II:  No diagnosis.  AXIS III:  Arterial hypertension, hypercholesterolemia.  AXIS IV:  Moderate.  AXIS V:  Upon discharge 50-55.   DISCHARGE MEDICATIONS:  1.  Zocor 40 mg per day.  2.  Accupril 20 mg daily.  3.  Elavil 25 at night.  4.  Prozac 10 mg daily.  5.  Ambien CR 12.5 at night.  6.  Zyprexa Zydis 10 mg at bedtime.   DISPOSITION:  Follow up with Dr. Lang Snow and [facility].      Geoffery Lyons, M.D.  Electronically Signed     IL/MEDQ  D:  09/02/2005  T:  09/03/2005  Job:  [id number]

## 2012-03-06 ENCOUNTER — Ambulatory Visit (INDEPENDENT_AMBULATORY_CARE_PROVIDER_SITE_OTHER): Payer: Medicaid Other | Admitting: Family Medicine

## 2012-03-06 ENCOUNTER — Other Ambulatory Visit (HOSPITAL_COMMUNITY)
Admission: RE | Admit: 2012-03-06 | Discharge: 2012-03-06 | Disposition: A | Discharge status: Home / Self Care | Payer: Medicaid Other | Source: Ambulatory Visit | Attending: Family Medicine | Admitting: Family Medicine

## 2012-03-06 ENCOUNTER — Encounter: Payer: Self-pay | Admitting: Family Medicine

## 2012-03-06 VITALS — BP 136/88 | HR 77 | Temp 98.3°F | Ht 62.0 in | Wt 119.5 lb

## 2012-03-06 DIAGNOSIS — Z0000 Encounter for general adult medical examination without abnormal findings: Secondary | ICD-10-CM

## 2012-03-06 DIAGNOSIS — I10 Essential (primary) hypertension: Secondary | ICD-10-CM

## 2012-03-06 DIAGNOSIS — E7800 Pure hypercholesterolemia, unspecified: Secondary | ICD-10-CM

## 2012-03-06 DIAGNOSIS — M25569 Pain in unspecified knee: Secondary | ICD-10-CM

## 2012-03-06 DIAGNOSIS — Z01419 Encounter for gynecological examination (general) (routine) without abnormal findings: Secondary | ICD-10-CM | POA: Insufficient documentation

## 2012-03-06 DIAGNOSIS — J309 Allergic rhinitis, unspecified: Secondary | ICD-10-CM

## 2012-03-06 DIAGNOSIS — F329 Major depressive disorder, single episode, unspecified: Secondary | ICD-10-CM

## 2012-03-06 DIAGNOSIS — Z124 Encounter for screening for malignant neoplasm of cervix: Secondary | ICD-10-CM

## 2012-03-06 LAB — BASIC METABOLIC PANEL
Chloride: 105 mEq/L (ref 96–112)
Creat: 0.85 mg/dL (ref 0.50–1.10)
Potassium: 3.9 mEq/L (ref 3.5–5.3)
Sodium: 143 mEq/L (ref 135–145)

## 2012-03-06 MED ORDER — OLOPATADINE HCL 0.1 % OP SOLN: 1.0000 [drp] | Freq: Two times a day (BID) | OPHTHALMIC | Status: DC

## 2012-03-06 MED ORDER — VARICELLA-ZOSTER IMMUNE GLOB 125 UNITS IJ SOLR: 1.0000 "application " | Freq: Once | INTRAMUSCULAR | Status: DC

## 2012-03-06 MED ORDER — LISINOPRIL 40 MG PO TABS: 40.0000 mg | ORAL_TABLET | Freq: Every day | ORAL | Status: DC

## 2012-03-06 MED ORDER — OLOPATADINE HCL 0.2 % OP SOLN: 1.0000 [drp] | Freq: Two times a day (BID) | OPHTHALMIC | Status: DC

## 2012-03-06 MED ORDER — SIMVASTATIN 40 MG PO TABS: 40.0000 mg | ORAL_TABLET | Freq: Every day | ORAL | Status: DC

## 2012-03-06 NOTE — Assessment & Plan Note (Signed)
Pap smear performed. If normal, will not check further due to patient being 66 years old today.  Mammogram scheduling information given.  Discussed colonoscopy and hemoccult cards. Patient does not want at this time but will monitor for blood in stool. Tdap today. Shingles vaccine Rx given.

## 2012-03-06 NOTE — Assessment & Plan Note (Signed)
Stable with Patinol. Will refill.

## 2012-03-06 NOTE — Progress Notes (Signed)
  Subjective:    Patient ID: Abigail Riley, female    DOB: 03-16-1946, 66 y.o.   MRN: [id number]  HPI Here for routine physical.  No complaints.  1. Preventative Would like Pap smear today  2. Allergic conjunctivitis Stable on Patanol  3. Hypertension Denies chest pain, difficult breathing Does not check BP at home Compliant with lisinopril  Review of Systems Per HPI    Objective:   Physical Exam Gen: NAD HEENT: normal conjunctiva without tearing/erythema; no nasal congestion/rhinorrhea CV: RRR, no m/r/g Pulm: CTAB without w/r/r Abd: NABS, soft, NT, ND GU:   Vulva: normal   Vagina: atrophic, no erythema   Cervix: normal  Pap smear performed.     Assessment & Plan:

## 2012-03-06 NOTE — Assessment & Plan Note (Signed)
Controlled. Continue lisinopril.  Will check LDL and BMET (Cr) today.

## 2012-03-06 NOTE — Progress Notes (Signed)
Addended by: Madolyn Frieze, Marylene Land J on: 03/06/2012 04:11 PM   Modules accepted: Orders

## 2012-03-06 NOTE — Assessment & Plan Note (Addendum)
See hypertension A/P.   UPDATE: Direct LDL elevated 210s. Will stop simvastatin 40 (max) and change to atorvastatin 40.  Discussed plan with patient's son on the phone.

## 2012-03-06 NOTE — Patient Instructions (Signed)
If your lab results are normal, I will send you a letter with the results. If abnormal, someone at the clinic will get in touch with you.   Follow-up as needed or in one year for your next physical.

## 2012-03-06 NOTE — Progress Notes (Signed)
Addended by: Garen Grams F on: 03/06/2012 03:49 PM   Modules accepted: Orders

## 2012-03-09 ENCOUNTER — Encounter: Payer: Self-pay | Admitting: Family Medicine

## 2012-03-10 ENCOUNTER — Encounter: Payer: Self-pay | Admitting: Family Medicine

## 2012-03-12 MED ORDER — ATORVASTATIN CALCIUM 40 MG PO TABS: 40.0000 mg | ORAL_TABLET | Freq: Every day | ORAL | Status: DC

## 2012-03-12 NOTE — Progress Notes (Signed)
Addended by: Priscella Mann J on: 03/12/2012 12:36 PM   Modules accepted: Orders

## 2012-03-13 ENCOUNTER — Other Ambulatory Visit: Payer: Self-pay | Admitting: Family Medicine

## 2012-08-04 ENCOUNTER — Other Ambulatory Visit: Payer: Self-pay | Admitting: Family Medicine

## 2012-11-06 ENCOUNTER — Ambulatory Visit (INDEPENDENT_AMBULATORY_CARE_PROVIDER_SITE_OTHER): Payer: Medicare Other | Admitting: Family Medicine

## 2012-11-06 ENCOUNTER — Encounter: Payer: Self-pay | Admitting: Family Medicine

## 2012-11-06 ENCOUNTER — Other Ambulatory Visit: Payer: Self-pay | Admitting: Family Medicine

## 2012-11-06 VITALS — BP 106/76 | HR 88 | Temp 98.5°F | Ht 62.0 in | Wt 114.1 lb

## 2012-11-06 DIAGNOSIS — I10 Essential (primary) hypertension: Secondary | ICD-10-CM

## 2012-11-06 DIAGNOSIS — F329 Major depressive disorder, single episode, unspecified: Secondary | ICD-10-CM

## 2012-11-06 DIAGNOSIS — M818 Other osteoporosis without current pathological fracture: Secondary | ICD-10-CM

## 2012-11-06 DIAGNOSIS — F3289 Other specified depressive episodes: Secondary | ICD-10-CM

## 2012-11-06 DIAGNOSIS — E785 Hyperlipidemia, unspecified: Secondary | ICD-10-CM

## 2012-11-06 DIAGNOSIS — R5383 Other fatigue: Secondary | ICD-10-CM

## 2012-11-06 DIAGNOSIS — R5381 Other malaise: Secondary | ICD-10-CM

## 2012-11-06 LAB — LIPID PANEL
LDL Cholesterol: 79 mg/dL (ref 0–99)
Total CHOL/HDL Ratio: 2.8 Ratio
VLDL: 22 mg/dL (ref 0–40)

## 2012-11-06 LAB — TSH: TSH: 0.409 u[IU]/mL (ref 0.350–4.500)

## 2012-11-06 MED ORDER — MIRTAZAPINE 15 MG PO TBDP: 15.0000 mg | ORAL_TABLET | Freq: Every day | ORAL | Status: DC

## 2012-11-06 NOTE — Patient Instructions (Addendum)
Take medication called Remeron to help with your mood (sadness, appetite, sleep). Take at bedtime.   We will talk about your lab results in 1 month.   Follow-up in 1 month.

## 2012-11-06 NOTE — Assessment & Plan Note (Signed)
Her blood pressure remains low. She is taking lisinopril 20 mg. She was advised to stop taking this medication, especially with her fatigue.

## 2012-11-06 NOTE — Assessment & Plan Note (Signed)
We will try mirtazepine to see if this helps with depressive symptoms, appetite, and sleep. We discussed how medications are limited and managing stressors will be most important things. We sympathized with her family situation. Follow-up in 1 month. I strongly encouraged patient and friend to bring son to next interview.

## 2012-11-06 NOTE — Progress Notes (Signed)
  Subjective:    Patient ID: Abigail Riley, female    DOB: 23-Nov-1945, 66 y.o.   MRN: [id number]  HPI Patient is brought by her friend with complaints of problems with her memory, left-sided body aches, and trouble sleeping for the past month. Patient's friend is interpreting because patient does not speak [place].  She feels a lot of stress from her family. She feels like they do not care about her. She lives with a nephew. She has children, but they are not significantly involved in her life. She feels like they do not care about her.  She feels sad.   Review of Systems Denies weakness Endorses fatgiue  Allergies, medication, past medical history reviewed.      Objective:   Physical Exam GEN: NAD; well-nourished, -appearing PSYCH: depressed but not anxious appearing MSK/NEURO:   Alert and oriented to name, place, and date; appropriate to questions; normally interactive   Strength upper and lower extremities 5/5 bilaterally without focal deficits   Gait normal, non-antalgic although her left shoulder stoops   Back: no scoliosis    Assessment & Plan:

## 2012-12-04 ENCOUNTER — Ambulatory Visit: Payer: Medicare Other | Admitting: Family Medicine

## 2012-12-12 ENCOUNTER — Other Ambulatory Visit: Payer: Self-pay | Admitting: Family Medicine

## 2012-12-14 MED ORDER — OLOPATADINE HCL 0.2 % OP SOLN: 1.0000 [drp] | Freq: Every day | OPHTHALMIC | Status: DC

## 2012-12-14 NOTE — Addendum Note (Signed)
Addended by: Madolyn Frieze, Marylene Land J on: 12/14/2012 12:50 PM   Modules accepted: Orders

## 2013-07-10 ENCOUNTER — Other Ambulatory Visit: Payer: Self-pay | Admitting: Family Medicine

## 2013-08-13 ENCOUNTER — Other Ambulatory Visit: Payer: Self-pay | Admitting: Family Medicine

## 2013-08-19 ENCOUNTER — Encounter: Payer: Self-pay | Admitting: Family Medicine

## 2013-08-19 ENCOUNTER — Ambulatory Visit (INDEPENDENT_AMBULATORY_CARE_PROVIDER_SITE_OTHER): Payer: Medicare Other | Admitting: Family Medicine

## 2013-08-19 VITALS — BP 144/96 | HR 95 | Temp 97.9°F | Ht 62.0 in | Wt 121.5 lb

## 2013-08-19 DIAGNOSIS — F329 Major depressive disorder, single episode, unspecified: Secondary | ICD-10-CM

## 2013-08-19 DIAGNOSIS — Z1211 Encounter for screening for malignant neoplasm of colon: Secondary | ICD-10-CM

## 2013-08-19 DIAGNOSIS — I10 Essential (primary) hypertension: Secondary | ICD-10-CM

## 2013-08-19 MED ORDER — TRAMADOL HCL 50 MG PO TABS: 50.0000 mg | ORAL_TABLET | Freq: Every evening | ORAL | Status: DC | PRN

## 2013-08-19 MED ORDER — LISINOPRIL 20 MG PO TABS: 20.0000 mg | ORAL_TABLET | Freq: Every day | ORAL | Status: DC

## 2013-08-19 NOTE — Assessment & Plan Note (Signed)
Ran out of lisinopril 40mg  several weeks ago. BPs in chart appear to be roughly target goal of under 140/90. Will restart on lisinopril 20mg 

## 2013-08-19 NOTE — Progress Notes (Signed)
  Subjective:    Patient ID: Abigail Riley, female    DOB: 1946-01-16, 66 y.o.   MRN: [id number]  HPI  Here with friend who acts as interpreter. Patient signed paperwork stating she declined use of the phone interpreter.  # Healthcare maintenance - Never had a colonoscopy - Last mammogram in 04-11-2005 - Never received pneumovax  # Depression/insomnia: - Still feeling sad, husband passed away in 04/12/2007, sons do not seem to stay in touch with her - No SI - Did not continue taking mirtazapine because she said it gave her rashes in her legs. - Does not describe anhedonia - Describes poor sleeping, trouble getting to and staying asleep - No major changes in weight   Review of Systems See HPI    Objective:   Physical Exam BP 144/96  Pulse 95  Temp(Src) 97.9 F (36.6 C) (Oral)  Ht 5\' 2"  (1.575 m)  Wt 121 lb 8 oz (55.112 kg)  BMI 22.22 kg/m2  General: NAD, appears stated age HEENT: PERRL, EOMI, neck supple CV: RRR, normal heart sounds, no murmurs. no carotid bruits Resp: CTAB, effort normal Abdomen: soft, nondistended, nontender, bowel sounds present Extremities: no leg edema, 1+ PT pulses bilaterally Psych: mood and affect normal. No psychomotor agitation     Assessment & Plan:  See Problem List documentation

## 2013-08-19 NOTE — Patient Instructions (Addendum)
Medications to take: Lisinopril 20mg  once a day Atorvastatin 40mg  once a day Tramadol at night as needed for sleep  Call the [facility] center to schedule an appointment for your mammogram. This should be done every year.  Referral to a gastroenterologist doctor to get a colonoscopy done.  Return in 1 month for follow up.

## 2013-08-19 NOTE — Assessment & Plan Note (Signed)
Patient did not take the mirtazapine more than a few doses because it gave her a rash. Does not appear to be severely depressed. Continued to discuss with her that she needs to have a discussion with her son about how she feels.  Plan: - Tramadol PRN for sleep - Follow up in 1 month

## 2013-11-30 ENCOUNTER — Ambulatory Visit (INDEPENDENT_AMBULATORY_CARE_PROVIDER_SITE_OTHER): Payer: Medicare Other | Admitting: Family Medicine

## 2013-11-30 ENCOUNTER — Ambulatory Visit (HOSPITAL_COMMUNITY)
Admission: RE | Admit: 2013-11-30 | Discharge: 2013-11-30 | Disposition: A | Discharge status: Home / Self Care | Payer: Medicare Other | Source: Ambulatory Visit | Attending: Family Medicine | Admitting: Family Medicine

## 2013-11-30 VITALS — BP 158/85 | HR 73 | Temp 98.0°F | Wt 117.0 lb

## 2013-11-30 DIAGNOSIS — F329 Major depressive disorder, single episode, unspecified: Secondary | ICD-10-CM

## 2013-11-30 DIAGNOSIS — I10 Essential (primary) hypertension: Secondary | ICD-10-CM | POA: Insufficient documentation

## 2013-11-30 DIAGNOSIS — R9431 Abnormal electrocardiogram [ECG] [EKG]: Secondary | ICD-10-CM | POA: Insufficient documentation

## 2013-11-30 DIAGNOSIS — F3289 Other specified depressive episodes: Secondary | ICD-10-CM

## 2013-11-30 MED ORDER — CITALOPRAM HYDROBROMIDE 20 MG PO TABS
20.0000 mg | ORAL_TABLET | Freq: Every day | ORAL | Status: DC
Start: 2013-11-30 — End: 2015-04-13

## 2013-11-30 NOTE — Assessment & Plan Note (Addendum)
PHQ9 14: Moderate Depression-discussed options of medication or counseling/CBT and patient opted for medication (Will start with celexa 20mg ) and wants to use counseling through [facility]. Will follow up q 7-10 days until improving then on monthly basis using PHQ9.  Regarding medication, will assess safety (SI), tolerability (SE), and efficacy (repeat PHQ9) at next visit and titrate up as needed.  Regarding SI, patient denies  -MDQ not indicated. -baseline labs (TSH, CBC, CMP) as above  -Baseline EKG ordered to evaluate QT prolongation and none present. New RBBB noted but of no clear signficance.  -discussed need for lifelong therapy given recurrence.  -Discussed remission may take several months

## 2013-11-30 NOTE — Patient Instructions (Addendum)
tart taking Celexa 20mg  daily. See us in 7-10 days to follow up. I am sorry you are struggling with depression, since it has come back, you will likely need to be on medication long term. Please continue taking it on a regular basis.   AVS (depressionmed):  Taking the medicine as directed and not missing any doses is one of the best things you can do to treat your depression.  Here are some things to keep in mind:  1) Side effects (stomach upset, some increased anxiety) may happen before you notice a benefit.  These side effects typically go away over time. 2) Changes to your dose of medicine or a change in medication all together is sometimes necessary 3) Many people will notice an improvement within two weeks but the full effect of the medication can take up to 4-6 weeks 4) Stopping the medication when you start feeling better often results in a return of symptoms 5) If you start having thoughts of hurting yourself or others after starting this medicine, please call me at [phone] immediately.

## 2013-11-30 NOTE — Progress Notes (Signed)
Tana Conch, MD Phone: [phone]  Subjective:  Chief complaint-noted  Concern for Depression  HPI:  Patient diagnosed with depression over 15 years ago in [place]. She has suffered with recurrent episodes since that time. She was tried on remeron on the past but she had a rash with this so she stopped. 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 Presents with friend who interprets for her at each visit.   Symptoms (SIGECAPS):   1. Depressed Mood: Yes   2. Decreased Interest: Yes   3. SI/HI: NO   4. PHQ9 14, GAD7 of 7   5. MDQ not indicated   6. Level of Impairment: very difficult  Medical History including Psychiatric   1. Ever on psych meds: yes, unknown drug in [place]    Name of meds and # failures: remeron most recently-rash reported  2. Psych history/ever hospitalized: no  3. Alcohol drinks per week: 0; smoking no2; other drugs: no  4. History of thyroid disease or anemia: no  5. History of traumatic event (PTSD?): no, but loss of husband difficult  Past medical history-HLD, HTN  Family history:  1. Any history of psychiatric issues: not sure  2. Specifically bipolar: not known  Medications- reviewed and updated Current Outpatient Prescriptions  Medication Sig Dispense Refill  . acetaminophen (TYLENOL) 500 MG tablet Take 1,000 mg by mouth 3 (three) times daily.        Marland Kitchen atorvastatin (LIPITOR) 40 MG tablet take 1 tablet by mouth once daily  30 tablet  6  . lisinopril (PRINIVIL,ZESTRIL) 20 MG tablet Take 1 tablet (20 mg total) by mouth daily.  30 tablet  12  . Olopatadine HCl (PATADAY) 0.2 % SOLN Place 1 drop into both eyes daily.  1 Bottle  3  . traMADol (ULTRAM) 50 MG tablet Take 1 tablet (50 mg total) by mouth at bedtime as needed.  30 tablet  0   No current facility-administered medications for this visit.    Objective: BP 158/85  Pulse 73  Temp(Src) 98 F (36.7 C) (Oral)  Wt 117 lb (53.071 kg) Gen: NAD, resting comfortably in chair Neuro:  grossly normal, moves all extremities Psych: depressed mood, slow in speech and movement  Recent TSH:  Lab Results  Component Value Date   TSH 0.409 11/06/2012   Recent CBC (Hgb):  Lab Results  Component Value Date   HGB 14.3 06/19/2007   Recent CMET (baseline):   Chemistry      Component Value Date/Time   NA 143 03/06/2012 1537   K 3.9 03/06/2012 1537   CL 105 03/06/2012 1537   CO2 29 03/06/2012 1537   BUN 11 03/06/2012 1537   CREATININE 0.85 03/06/2012 1537   CREATININE 0.85 12/11/2009 2054      Component Value Date/Time   CALCIUM 10.0 03/06/2012 1537   ALKPHOS 69 07/07/2009 1828   AST 28 07/07/2009 1828   ALT 32 07/07/2009 1828   BILITOT 0.4 07/07/2009 1828      Baseline EKG to evaluate QT prolongation qtc 452   Assessment/Plan:  DEPRESSION PHQ9 14: Moderate Depression-discussed options of medication or counseling/CBT and patient opted for medication (Will start with celexa 20mg ) and wants to use counseling through [facility]. Will follow up q 7-10 days until improving then on monthly basis using PHQ9.  Regarding medication, will assess safety (SI), tolerability (SE), and efficacy (repeat PHQ9) at next visit and titrate up as needed.  Regarding SI, patient denies  -MDQ not indicated. -baseline labs (TSH,  CBC, CMP) as above  -Baseline EKG ordered to evaluate QT prolongation and none present. New RBBB noted but of no clear signficance.  -discussed need for lifelong therapy given recurrence.  -Discussed remission may take several months     Orders Placed This Encounter  Procedures  . EKG 12-Lead    Meds ordered this encounter  Medications  . citalopram (CELEXA) 20 MG tablet    Sig: Take 1 tablet (20 mg total) by mouth daily.    Dispense:  90 tablet    Refill:  3

## 2013-12-02 ENCOUNTER — Encounter: Payer: Self-pay | Admitting: Family Medicine

## 2013-12-13 ENCOUNTER — Ambulatory Visit (INDEPENDENT_AMBULATORY_CARE_PROVIDER_SITE_OTHER): Payer: Medicare Other | Admitting: Family Medicine

## 2013-12-13 ENCOUNTER — Encounter: Payer: Self-pay | Admitting: Family Medicine

## 2013-12-13 VITALS — BP 142/86 | HR 76 | Temp 98.6°F | Ht 62.0 in | Wt 118.0 lb

## 2013-12-13 DIAGNOSIS — F3289 Other specified depressive episodes: Secondary | ICD-10-CM

## 2013-12-13 DIAGNOSIS — I10 Essential (primary) hypertension: Secondary | ICD-10-CM

## 2013-12-13 DIAGNOSIS — F329 Major depressive disorder, single episode, unspecified: Secondary | ICD-10-CM

## 2013-12-13 NOTE — Patient Instructions (Addendum)
It was good to see you again.  I am glad that you feel you are doing better. Please continue taking the Celexa (citalopram) 20mg  a day. I would like you to follow up in 2 weeks and we will decide then if we need to increase the dose of the medication. Continue to use counseling at your church, this should be very helpful.  Your sleep should start to improve as the medication starts to work and your mood improves. If you would like, you can try an over the counter medication called Melatonin, the starting dosage is 0.3mg . This will not be covered by your insurance.  Continue taking all of your other medications as prescribed.

## 2013-12-13 NOTE — Progress Notes (Signed)
   Subjective:    Patient ID: Abigail Riley, female    DOB: 26-Feb-1946, 68 y.o.   MRN: [id number]  HPI  Accompanied by friend, she was used as an Equities traderinterpreter; patient declines to have phone interpreter  # Depression f/u - she thinks she is doing better since last visit - Medication: started on 20mg  citalopram on 11/30/13. She has not noticed any side effects. - Mood is "better", and "about a 1" (referring to PHQ9) - Activity: due to colder weather she has been staying at home more, but friend says she has been encouraging her to get out and visit people and she has increased this somewhat. She has been talking with support groups from her church and thinks this is helping. - Sleep: still having poor sleep. She sometimes gets 1-2 hours (last night got 4 hours, night before got almost 0). She doesn't elaborate if this is difficulty initiating or maintaining sleep. She says the tramadol keeps her up when she takes it. - has been talking more with her son, primarily on weekends because he works odd shifts during the week. - Denies SI/HI - PHQ9 score 11;not difficult at all  Review of Systems No CP, no SOB, no abdominal pain. No dizziness. Poor sleep hygiene.     Objective:   Physical Exam BP 142/86  Pulse 76  Temp(Src) 98.6 F (37 C) (Oral)  Ht 5\' 2"  (1.575 m)  Wt 118 lb (53.524 kg)  BMI 21.58 kg/m2  General: NAD, sitting in bed, appears pleasant CV: RRR, normal heart sounds, no murmurs appreciated Resp: CTAB, normal effort Abdomen: soft, non-tender and non-distended. Bowel sounds present Ext: no edema or cyanosis     Assessment & Plan:  See Problem List documentation

## 2013-12-13 NOTE — Assessment & Plan Note (Addendum)
BP stable, continue current regimen (lisinopril 20mg ).

## 2013-12-13 NOTE — Assessment & Plan Note (Signed)
Appears to be improving, though only about 2 weeks since starting citalopram. PHQ9 11;not difficult at all. Denies SI/HI. Encourage to continue getting out of the house, discussions with support groups at church. Plan to follow up in 2 weeks and re-eval need for titrating dose.

## 2013-12-28 ENCOUNTER — Ambulatory Visit (INDEPENDENT_AMBULATORY_CARE_PROVIDER_SITE_OTHER): Payer: Medicare Other | Admitting: Family Medicine

## 2013-12-28 ENCOUNTER — Encounter: Payer: Self-pay | Admitting: Family Medicine

## 2013-12-28 VITALS — BP 156/92 | HR 65 | Ht 62.0 in | Wt 118.0 lb

## 2013-12-28 DIAGNOSIS — I10 Essential (primary) hypertension: Secondary | ICD-10-CM

## 2013-12-28 DIAGNOSIS — G4700 Insomnia, unspecified: Secondary | ICD-10-CM

## 2013-12-28 DIAGNOSIS — F329 Major depressive disorder, single episode, unspecified: Secondary | ICD-10-CM

## 2013-12-28 DIAGNOSIS — F3289 Other specified depressive episodes: Secondary | ICD-10-CM

## 2013-12-28 MED ORDER — ATORVASTATIN CALCIUM 40 MG PO TABS: 40.0000 mg | ORAL_TABLET | Freq: Every day | ORAL | Status: DC

## 2013-12-28 MED ORDER — LISINOPRIL 40 MG PO TABS: 40.0000 mg | ORAL_TABLET | Freq: Every day | ORAL | Status: DC

## 2013-12-28 MED ORDER — TRAZODONE HCL 50 MG PO TABS: 25.0000 mg | ORAL_TABLET | Freq: Every evening | ORAL | Status: DC | PRN

## 2013-12-28 NOTE — Progress Notes (Signed)
   Subjective:    Patient ID: Abigail Riley, female    DOB: Nov 09, 1946, 68 y.o.   MRN: [id number]  HPI  Accompanied by friend as interpreter, declines phone interpreter (previously signed paperwork declining)  CC: depression f/u  # Depression - feels much improved, describes mood as "good" - doing more activity around house/outside home - no SI/HI - PHQ9 = 2/not difficult  # Insomnia - this is her biggest complaint - still has trouble with sleep onset, staying asleep - gets max of 3 hours a night, sometimes none  Review of Systems No CP, SOB, abdominal pain, fevers/chills. Endorses daytime sleepin ess    Objective:   Physical Exam BP 156/92  Pulse 65  Ht 5\' 2"  (1.575 m)  Wt 118 lb (53.524 kg)  BMI 21.58 kg/m2  General: NAD CV: RRR, no murmurs Resp: CTAB, normal effort      Assessment & Plan:  See Problem List documentation

## 2013-12-28 NOTE — Patient Instructions (Signed)
It was good to see you today. I am glad that you are feeling better.  I re-ordered your medications so you should have refills through until at least June.   Blood pressure medication: Increase your Lisinopril to 40mg  a day. You can take 2 tablets of the 20mg  until you run out, your new prescription should be 40mg  tablets.  Sleep: Trazodone: take 0.5 to 1 tablet a night for sleep.

## 2013-12-29 NOTE — Assessment & Plan Note (Signed)
A: Continued insomnia. Discussed sleep hygiene P: Trazodone 25-50mg . Previously had EKG before starting citalopram with no evidence of QTc.

## 2013-12-29 NOTE — Assessment & Plan Note (Signed)
A: elevated to 156/92 P: increase lisinopril to 40mg  (previously on this dose before the fall, when she presented to office after running out for several weeks and still had low BP. Given she has had several above goal BPs on 20mg , will go back to 40mg  dose).

## 2013-12-29 NOTE — Assessment & Plan Note (Signed)
A: PHQ9 scores 14>11>2 today. Patient says she is much improved. No SI/HI P: Continue current dose of celexa 20mg 

## 2014-01-19 ENCOUNTER — Encounter: Payer: Self-pay | Admitting: Family Medicine

## 2014-01-19 ENCOUNTER — Ambulatory Visit (INDEPENDENT_AMBULATORY_CARE_PROVIDER_SITE_OTHER): Payer: Medicare Other | Admitting: Family Medicine

## 2014-01-19 VITALS — BP 135/88 | HR 69 | Temp 98.7°F | Wt 121.0 lb

## 2014-01-19 DIAGNOSIS — F3289 Other specified depressive episodes: Secondary | ICD-10-CM

## 2014-01-19 DIAGNOSIS — F329 Major depressive disorder, single episode, unspecified: Secondary | ICD-10-CM

## 2014-01-19 DIAGNOSIS — G4700 Insomnia, unspecified: Secondary | ICD-10-CM

## 2014-01-19 MED ORDER — OLOPATADINE HCL 0.2 % OP SOLN: 1.0000 [drp] | Freq: Every day | OPHTHALMIC | Status: DC

## 2014-01-19 NOTE — Progress Notes (Signed)
   Subjective:    Patient ID: Abigail Riley, female    DOB: Apr 30, 1946, 68 y.o.   MRN: [id number]  HPI  Accompanied by friend that usually interprets and [employer] interpreter  CC: Follow up for depression and insomnia  # Depression: - continues to do well, describes mood as good - no complaints, she is happy with taking the citalopram - PHQ9: 1; not difficult (1 for tired/little energy) - Denies SI/HI ROS: some memory difficulties  # Insomnia - much improved when taking 0.5 tablet of trazodone, now getting an average of 6 hours of sleep - felt she was getting slightly confused/memory difficulty if she took the full tablet - reports some occasional nightmares (1-2) over the past month ROS: improved energy but still does complain of some fatigue  Review of Systems   Denies CP, SOB, N/V/D. Per HPI for additional ROS. Objective:   Physical Exam BP 135/88  Pulse 69  Temp(Src) 98.7 F (37.1 C) (Oral)  Wt 121 lb (54.885 kg)  General: NAD, appears pleasant with new hair cut Cardiac: RRR, normal heart sounds, no murmurs. 2+ radial and PT pulses bilaterally Respiratory: CTAB, normal effort Skin: warm and dry, no rashes noted Psych: mood good, affect congruent. Normal prosody and rhythm of speech.     Assessment & Plan:  See Problem List Documentation

## 2014-01-19 NOTE — Patient Instructions (Signed)
I am glad that you are continuing to do better and now getting some sleep.  Continue taking just the half tablet of Trazodone to help you sleep. If the nightmares get more frequent or more intense, please stop taking them and call the clinic.  I would like to see you back in about 3 months for follow up. If you have any other questions or concerns, please do not hesitate to schedule an appointment sooner.

## 2014-01-19 NOTE — Assessment & Plan Note (Signed)
A: Improved insomnia on 25mg  trazodone. Reports some concern for memory difficulty and 1-2 nightmares over past month P: Continue 25mg  trazodone. If symptoms (memory, nightmares) worsen, discontinue medication and schedule f/u appt

## 2014-01-19 NOTE — Assessment & Plan Note (Signed)
A: PH9 scores 14>11>2>1 today. No SI/HI P: continue celexa 20mg , follow up in 3 months

## 2014-05-24 ENCOUNTER — Other Ambulatory Visit: Payer: Self-pay | Admitting: Family Medicine

## 2014-07-01 ENCOUNTER — Ambulatory Visit (INDEPENDENT_AMBULATORY_CARE_PROVIDER_SITE_OTHER): Payer: Medicare Other | Admitting: Family Medicine

## 2014-07-01 ENCOUNTER — Encounter: Payer: Self-pay | Admitting: Family Medicine

## 2014-07-01 VITALS — BP 162/89 | HR 83 | Temp 98.4°F | Wt 120.4 lb

## 2014-07-01 DIAGNOSIS — F329 Major depressive disorder, single episode, unspecified: Secondary | ICD-10-CM

## 2014-07-01 DIAGNOSIS — I10 Essential (primary) hypertension: Secondary | ICD-10-CM

## 2014-07-01 DIAGNOSIS — H9193 Unspecified hearing loss, bilateral: Secondary | ICD-10-CM

## 2014-07-01 DIAGNOSIS — H9190 Unspecified hearing loss, unspecified ear: Secondary | ICD-10-CM

## 2014-07-01 DIAGNOSIS — F3289 Other specified depressive episodes: Secondary | ICD-10-CM

## 2014-07-01 DIAGNOSIS — G4700 Insomnia, unspecified: Secondary | ICD-10-CM

## 2014-07-01 MED ORDER — TRAZODONE HCL 50 MG PO TABS: ORAL_TABLET | ORAL | Status: DC

## 2014-07-01 NOTE — Progress Notes (Signed)
Patient ID: Abigail Riley, female   DOB: 06-11-1946, 68 y.o.   MRN: [id number]   Subjective:    Patient ID: Abigail Riley, female    DOB: 06-11-1946, 68 y.o.   MRN: [id number]  HPI  Accompanied by her friend who is interpreting  CC: follow up  # Annual:  Mammogram: 09/21/2013, normal  Pap smear: 03/06/2012, normal  Colonoscopy: 12/06/2013, reportedly normal but [facility] not [facility]  Lipid panel: 11/06/2012, normal and on lipitor 40mg   # Hypertension  Missed her lisinopril dose today ROS: No CP, no headache, no SOB  # Depression / Insomnia:  Says she stopped taking her citalopram as she was taking the trazodone for sleep  States her mood is good, denies depression, denies SI/HI  Her children are still giving her issues; they called the police on her when she asked them for money she loaned to be given back.  Her sleep is much improved on the trazodone, she has no complaints about this  Denies SI/HI  # Hearing loss  Has had progressive hearing loss, states worse in right ear  Had damage to ears when she was younger during the [place] war ROS: No tinnitus  Review of Systems   No dizziness, no lightheadedness, no changes in vision, no difficulty swallowing, no abdominal pain, no heartburn, no diarrhea, no constipation Objective:  BP 162/89  Pulse 83  Temp(Src) 98.4 F (36.9 C) (Oral)  Wt 120 lb 6.4 oz (54.613 kg)  General: NAD   HEENT: PERRL, EOMI. Left TM pearly gray with good light reflex, no erythema or bulging. Right TM with 2 sclerotic lesions at 10-11 o'clock and 2-3 o'clock, otherwise no bulging or erythema. Hearing screen showed total bilateral hearing loss, screen repeated with same result (suspect language barrier is interfering with this) CV: RRR, normal heart sounds, no murmurs appreciated. 2+ pulses in radial, PT, DP, popliteal bilaterally Resp: CTAB, normal effort Abdomen: soft, nontender, nondistended, normal bowel sounds Extremities: no  edema or cyanosis. FROM x 4 extremities Neuro: alert and oriented, no focal deficits Psych: mood "good", affect congruent  Assessment & Plan:  See Problem List Documentation

## 2014-07-01 NOTE — Patient Instructions (Addendum)
It was good to see you again, I am glad to hear things are going well!  If you would like to get your flu shot, please call the clinic back in September. You do not need to see me for this visit, it can be with a nurse.  Since you have not been taking the citalopram, we will mark this has being STOPPED. If you notice your mood getting worse or depressed, please call the clinic and we can discuss restarting this.  For your hearing loss, we will refer you to an audiologist to see if hearing aids would be helpful for you.  Dr. Waynetta SandyWight

## 2014-07-01 NOTE — Assessment & Plan Note (Signed)
Not at goal today, however patient did not take medication. No evidence of hypertensive urgency. Previous office visits have all been at goal on current regimen. Plan: no changes to regimen, continue lisinopril 40mg 

## 2014-07-01 NOTE — Assessment & Plan Note (Signed)
A: PHQ-9 score 0, no SI/HI, off medications P: will discontinue citalopram now as patient has not been taking it, no signs/symptoms of depressed mood currently. Consider restarting if mood worsens. F/u 6 months

## 2014-07-01 NOTE — Assessment & Plan Note (Signed)
A: stable on trazodone, no additional complaints P: continue trazodone 25-50mg  as needed at night

## 2015-01-03 ENCOUNTER — Other Ambulatory Visit: Payer: Self-pay | Admitting: Family Medicine

## 2015-01-03 DIAGNOSIS — E7800 Pure hypercholesterolemia, unspecified: Secondary | ICD-10-CM

## 2015-01-03 DIAGNOSIS — I10 Essential (primary) hypertension: Secondary | ICD-10-CM

## 2015-04-13 ENCOUNTER — Encounter: Payer: Self-pay | Admitting: Family Medicine

## 2015-04-13 ENCOUNTER — Other Ambulatory Visit: Payer: Self-pay | Admitting: Family Medicine

## 2015-04-13 ENCOUNTER — Ambulatory Visit (INDEPENDENT_AMBULATORY_CARE_PROVIDER_SITE_OTHER): Payer: Medicare Other | Admitting: Family Medicine

## 2015-04-13 VITALS — BP 109/78 | HR 95 | Temp 98.6°F | Ht 62.0 in | Wt 125.0 lb

## 2015-04-13 DIAGNOSIS — M25562 Pain in left knee: Secondary | ICD-10-CM | POA: Diagnosis not present

## 2015-04-13 DIAGNOSIS — M545 Low back pain, unspecified: Secondary | ICD-10-CM

## 2015-04-13 DIAGNOSIS — Z9189 Other specified personal risk factors, not elsewhere classified: Secondary | ICD-10-CM

## 2015-04-13 DIAGNOSIS — G4700 Insomnia, unspecified: Secondary | ICD-10-CM

## 2015-04-13 DIAGNOSIS — I10 Essential (primary) hypertension: Secondary | ICD-10-CM | POA: Diagnosis not present

## 2015-04-13 DIAGNOSIS — E2839 Other primary ovarian failure: Secondary | ICD-10-CM

## 2015-04-13 DIAGNOSIS — Z0000 Encounter for general adult medical examination without abnormal findings: Secondary | ICD-10-CM

## 2015-04-13 DIAGNOSIS — Z13820 Encounter for screening for osteoporosis: Secondary | ICD-10-CM | POA: Diagnosis not present

## 2015-04-13 DIAGNOSIS — Z87898 Personal history of other specified conditions: Secondary | ICD-10-CM

## 2015-04-13 MED ORDER — ACETAMINOPHEN 500 MG PO TABS: 500.0000 mg | ORAL_TABLET | Freq: Four times a day (QID) | ORAL | Status: DC | PRN

## 2015-04-13 MED ORDER — TRAZODONE HCL 50 MG PO TABS: ORAL_TABLET | ORAL | Status: DC

## 2015-04-13 NOTE — Progress Notes (Signed)
Patient ID: Abigail Riley, female   DOB: Aug 20, 1946, 69 y.o.   MRN: [id number]   Subjective:    Patient ID: Abigail Riley, female    DOB: Aug 20, 1946, 69 y.o.   MRN: [id number]  HPI  CC: follow up  # Insomnia:  Reports continued control with trazodone. Doing well with only occasional issues with getting to sleep  Needs refill on trazodone  ROS: no daytime somnolence  # Joint pain  Fingers, left knee  Has tried fish oil, glucosamine, vitamin D  Has not tried any tylenol, ibuprofen, aleve  Says only gives her issues after long period of activity (example, left knee only hurts after about 1 hour of walking) ROS: no fevers/chills  # Hypertension  On lisinopril 20mg , taking it with no missed doses ROS: no CP, no SOB, no HA  Review of Systems   See HPI for ROS. All other systems reviewed and are negative.  Past medical history, surgical, family, and social history reviewed and updated in the EMR as appropriate. Objective:  BP 109/78 mmHg  Pulse 95  Temp(Src) 98.6 F (37 C) (Oral)  Ht 5\' 2"  (1.575 m)  Wt 125 lb (56.7 kg)  BMI 22.86 kg/m2 Vitals and nursing note reviewed  General: NAD CV: RRR, nl s1s2, no murmurs. 2+ radial, PT pulses bilaterally. No edema Resp: CTAB, normal effort MSK: no tenderness to DIP/PIP/MCP joints in either hands, no deformities noted, normal range of motion. No tenderness left knee, normal range of motion, no effusion noted.   Assessment & Plan:  See Problem List Documentation

## 2015-04-13 NOTE — Telephone Encounter (Signed)
Refill request from pharmacy. Will forward to PCP for review. Takeria Marquina, CMA. 

## 2015-04-13 NOTE — Patient Instructions (Signed)
Tylenol (Acetaminophen): 500 to 650mg , take every 4 to 6 hours as needed for joint pains.  Ibuprofen and aleve (naproxen) are the medicines that upset your stomach.

## 2015-04-14 NOTE — Assessment & Plan Note (Signed)
Primarily has pain in left knee. Discussed conservative management with OTC tylenol, which she has not been using. Last plain films of left knee >5 years ago, however I do not feel any additional imaging is needed at this time because her symptoms are mild. F/u as needed.

## 2015-04-14 NOTE — Assessment & Plan Note (Signed)
At goal. Refill sent for lisinopril. F/u 3-6 months.

## 2015-04-14 NOTE — Assessment & Plan Note (Signed)
Controlled since starting trazodone. Refill sent for trazodone. F/u as needed.

## 2015-04-14 NOTE — Assessment & Plan Note (Signed)
-   DEXA scan ordered

## 2015-04-19 ENCOUNTER — Other Ambulatory Visit: Payer: Medicare Other

## 2015-06-29 ENCOUNTER — Other Ambulatory Visit: Payer: Self-pay | Admitting: Family Medicine

## 2015-06-29 NOTE — Telephone Encounter (Signed)
Refill request from pharmacy. Will forward to PCP for review. Deylan Canterbury, CMA. 

## 2015-10-25 ENCOUNTER — Other Ambulatory Visit: Payer: Self-pay | Admitting: Family Medicine

## 2015-12-22 ENCOUNTER — Other Ambulatory Visit: Payer: Self-pay | Admitting: *Deleted

## 2015-12-22 MED ORDER — OLOPATADINE HCL 0.2 % OP SOLN: OPHTHALMIC | Status: DC

## 2015-12-28 ENCOUNTER — Other Ambulatory Visit: Payer: Self-pay | Admitting: *Deleted

## 2015-12-28 MED ORDER — ATORVASTATIN CALCIUM 40 MG PO TABS: 40.0000 mg | ORAL_TABLET | Freq: Every day | ORAL | Status: DC

## 2015-12-28 MED ORDER — LISINOPRIL 40 MG PO TABS: 40.0000 mg | ORAL_TABLET | Freq: Every day | ORAL | Status: DC

## 2016-01-03 ENCOUNTER — Ambulatory Visit (INDEPENDENT_AMBULATORY_CARE_PROVIDER_SITE_OTHER): Payer: Medicare Other | Admitting: Family Medicine

## 2016-01-03 ENCOUNTER — Encounter: Payer: Self-pay | Admitting: Family Medicine

## 2016-01-03 VITALS — BP 183/87 | HR 65 | Temp 98.1°F | Wt 128.7 lb

## 2016-01-03 DIAGNOSIS — Z0000 Encounter for general adult medical examination without abnormal findings: Secondary | ICD-10-CM | POA: Diagnosis not present

## 2016-01-03 DIAGNOSIS — E7800 Pure hypercholesterolemia, unspecified: Secondary | ICD-10-CM | POA: Diagnosis not present

## 2016-01-03 DIAGNOSIS — G4700 Insomnia, unspecified: Secondary | ICD-10-CM

## 2016-01-03 DIAGNOSIS — I10 Essential (primary) hypertension: Secondary | ICD-10-CM | POA: Diagnosis present

## 2016-01-03 DIAGNOSIS — Z1159 Encounter for screening for other viral diseases: Secondary | ICD-10-CM | POA: Diagnosis not present

## 2016-01-03 NOTE — Patient Instructions (Signed)
Knee pain: Continue Tylenol 500-650mg  every 6 hours as needed   If the pain gets worse, call and we can do a trial of a steroid injection in the knee here in the office

## 2016-01-03 NOTE — Progress Notes (Signed)
   Subjective:    Patient ID: Abigail Riley, female    DOB: 1946-08-26, 70 y.o.   MRN: [id number]  HPI  In-person interpreter from [facility] present  CC: annual, medication refills  # Healthcare maintenance:  Due for hepatitis C  Due for influenza vaccine  # Hypertension  States she is taking her medicines as prescribed, took this morning  Did not miss any doses  Does not notice any side effects ROS: no CP, no HA, no SOB  # Insomnia  Doing well with trazodone as needed  # Hyperlipidemia  No complaints on current dose  She would like to have her cholesterol checked again  Social Hx: never smoker  Review of Systems   See HPI for ROS.   Past medical history, surgical, family, and social history reviewed and updated in the EMR as appropriate. Objective:  BP 183/87 mmHg  Pulse 65  Temp(Src) 98.1 F (36.7 C) (Oral)  Wt 128 lb 11.2 oz (58.378 kg) Vitals and nursing note reviewed  General: no apparent distress, pleasant CV: normal rate, regular rhythm, no murmurs, rubs or gallop.  Resp: clear to auscultation bilaterally, normal effort  Assessment & Plan:  HYPERTENSION, BENIGN SYSTEMIC Not at goal. May be due to taking some cough/cold medicines recently (though I would not expect such a large increase based on this alone). Asked her to follow up with me in next 2-4 weeks for repeat BP check.  HYPERCHOLESTEROLEMIA Repeat lipid panel today  Insomnia Stable, continue trazodone as needed.   Healthcare maintenance Flu shot and hep C screen today.

## 2016-01-04 ENCOUNTER — Encounter: Payer: Self-pay | Admitting: Family Medicine

## 2016-01-04 LAB — BASIC METABOLIC PANEL WITH GFR
BUN: 11 mg/dL (ref 7–25)
CALCIUM: 9.9 mg/dL (ref 8.6–10.4)
CHLORIDE: 100 mmol/L (ref 98–110)
CO2: 31 mmol/L (ref 20–31)
Creat: 0.77 mg/dL (ref 0.50–0.99)
GFR, EST NON AFRICAN AMERICAN: 80 mL/min (ref >=60)
GFR, Est African American: >89 mL/min (ref >=60)
Glucose, Bld: 90 mg/dL (ref 65–99)
Potassium: 4.1 mmol/L (ref 3.5–5.3)
SODIUM: 140 mmol/L (ref 135–146)

## 2016-01-04 LAB — HEPATITIS C ANTIBODY: HCV AB: NEGATIVE

## 2016-01-04 LAB — LIPID PANEL
CHOL/HDL RATIO: 3.9 ratio (ref <=5.0)
Cholesterol: 147 mg/dL (ref 125–200)
HDL: 38 mg/dL — AB (ref >=46)
LDL Cholesterol: 87 mg/dL (ref <130)
Triglycerides: 108 mg/dL (ref <150)
VLDL: 22 mg/dL (ref <30)

## 2016-01-06 NOTE — Assessment & Plan Note (Signed)
Stable, continue trazodone as needed.

## 2016-01-06 NOTE — Assessment & Plan Note (Signed)
Not at goal. May be due to taking some cough/cold medicines recently (though I would not expect such a large increase based on this alone). Asked her to follow up with me in next 2-4 weeks for repeat BP check.

## 2016-01-06 NOTE — Assessment & Plan Note (Signed)
Repeat lipid panel today. 

## 2016-01-06 NOTE — Assessment & Plan Note (Signed)
Flu shot and hep C screen today.

## 2016-02-01 ENCOUNTER — Ambulatory Visit (INDEPENDENT_AMBULATORY_CARE_PROVIDER_SITE_OTHER): Payer: Medicare Other | Admitting: Family Medicine

## 2016-02-01 ENCOUNTER — Encounter: Payer: Self-pay | Admitting: Family Medicine

## 2016-02-01 VITALS — BP 147/83 | HR 90 | Temp 98.1°F | Wt 131.1 lb

## 2016-02-01 DIAGNOSIS — I10 Essential (primary) hypertension: Secondary | ICD-10-CM

## 2016-02-01 NOTE — Progress Notes (Signed)
   Subjective:    Patient ID: Abigail Riley, female    DOB: 1946-03-30, 70 y.o.   MRN: [id number]  HPI  CC: Blood pressure follow up   # Hypertension:  Measuring BP at home every day. This morning it was 169/112 before taking her medicines  Usually checks BP in the morning when first wakes up and it tends to be higher  Usually takes medicine at 9am  No pain  She is not sure what makes this better or worse ROS: no CP, no SOB, no HA  Social Hx: never smoker  Review of Systems   See HPI for ROS.   Past medical history, surgical, family, and social history reviewed and updated in the EMR as appropriate. Objective:  BP 147/83 mmHg  Pulse 90  Temp(Src) 98.1 F (36.7 C) (Oral)  Wt 131 lb 1.6 oz (59.467 kg) Vitals and nursing note reviewed  General: no apparent distress  CV: normal rate, regular rhythm, no murmurs, rubs or gallop Resp: clear to auscultation bilaterally, normal effort  Assessment & Plan:  HYPERTENSION, BENIGN SYSTEMIC At goal in clinic today. Does report some elevated BPs at home, discussed writing these down and returning to clinic if persistently above 150/90. Follow up 3 months otherwise for repeat check.

## 2016-02-01 NOTE — Patient Instructions (Signed)
Blood pressure goal: less than 150 / 90.  If above this consistently, come back sooner.   Otherwise follow up in about 3 months.

## 2016-02-05 NOTE — Assessment & Plan Note (Signed)
At goal in clinic today. Does report some elevated BPs at home, discussed writing these down and returning to clinic if persistently above 150/90. Follow up 3 months otherwise for repeat check.

## 2016-04-18 ENCOUNTER — Encounter: Payer: Self-pay | Admitting: Family Medicine

## 2016-04-18 ENCOUNTER — Ambulatory Visit (INDEPENDENT_AMBULATORY_CARE_PROVIDER_SITE_OTHER): Payer: Medicare Other | Admitting: Family Medicine

## 2016-04-18 ENCOUNTER — Other Ambulatory Visit: Payer: Self-pay | Admitting: Family Medicine

## 2016-04-18 VITALS — BP 138/84 | HR 92 | Temp 98.0°F | Ht 62.0 in | Wt 131.4 lb

## 2016-04-18 DIAGNOSIS — Z1231 Encounter for screening mammogram for malignant neoplasm of breast: Secondary | ICD-10-CM | POA: Diagnosis not present

## 2016-04-18 DIAGNOSIS — I10 Essential (primary) hypertension: Secondary | ICD-10-CM | POA: Diagnosis not present

## 2016-04-18 DIAGNOSIS — Z0000 Encounter for general adult medical examination without abnormal findings: Secondary | ICD-10-CM

## 2016-04-18 DIAGNOSIS — Z23 Encounter for immunization: Secondary | ICD-10-CM | POA: Diagnosis not present

## 2016-04-18 MED ORDER — AMLODIPINE BESYLATE 5 MG PO TABS: 5.0000 mg | ORAL_TABLET | Freq: Every day | ORAL | Status: DC

## 2016-04-18 NOTE — Assessment & Plan Note (Signed)
At goal in clinic however she is reporting frequent home readings that are above goal. Will add amlodipine 5mg  and asked that she bring her home BP cuff to next appointment. If still persistently elevated follow up 2-4 weeks, otherwise follow up 3 months.

## 2016-04-18 NOTE — Progress Notes (Signed)
   Subjective:    Patient ID: Abigail Riley, female    DOB: Oct 15, 1946, 70 y.o.   MRN: [id number]  HPI  [name] #[id number]  CC: annual  # Annual:  Received letter say she was due for pap smear and mammogram  Due for 2nd pneumonia shot ROS: no changes in vision, no trouble swallowing, some occasional heartburn doesn't use medicine, no chest pain no shortness of breath   # Hypertension  Has home machine, measures 3 times a day. Says it can be as high as 160-170s/110s.   She is concerned about the frequency of the high BPs. Mostly notices in the morning. Seems to be better after taking medicine and after exercising.  ROS: no HA, no CP, no shortness of breath   Social Hx: never smoker  Review of Systems   See HPI for ROS.   Past medical history, surgical, family, and social history reviewed and updated in the EMR as appropriate. Objective:  BP 138/84 mmHg  Pulse 92  Temp(Src) 98 F (36.7 C) (Oral)  Ht 5\' 2"  (1.575 m)  Wt 131 lb 6.4 oz (59.603 kg)  BMI 24.03 kg/m2 Vitals and nursing note reviewed  General: no apparent distress  Eyes: PERRL, EOMI, normal conjunctiva and sclera ENTM: normal TMs bilaterally. Normal oral mucosa, moist mucous membranes.  Neck: supple CV: normal rate, regular rhythm, no murmur appreciated. 2+ radial and DP pulses bilaterally  Resp: clear to auscultation bilaterally, normal effort Abdomen: soft, nontender, nondistended, no organomegaly, normal bowel sounds  MSK: she has tenderness on the medial aspect of left knee, no effusion, no redness or swelling. She has bilateral bunions great toes, no erythema or warmth Neuro: alert and oriented, no gross deficits Psych: normal mood and affect, normal thought content.  Assessment & Plan:  HYPERTENSION, BENIGN SYSTEMIC At goal in clinic however she is reporting frequent home readings that are above goal. Will add amlodipine 5mg  and asked that she bring her home BP cuff to next appointment. If still persistently  elevated follow up 2-4 weeks, otherwise follow up 3 months.  Healthcare maintenance Dexa and mammogram scheduled today. PPSV23 given today.    Return in about 3 months (around 07/19/2016).

## 2016-04-18 NOTE — Patient Instructions (Addendum)
Bring your blood pressure machine to your next appointment   Start the amlodipine once a day

## 2016-04-18 NOTE — Assessment & Plan Note (Signed)
Dexa and mammogram scheduled today. PPSV23 given today.

## 2016-04-22 ENCOUNTER — Other Ambulatory Visit: Payer: Self-pay | Admitting: *Deleted

## 2016-04-24 MED ORDER — TRAZODONE HCL 50 MG PO TABS: 25.0000 mg | ORAL_TABLET | Freq: Every evening | ORAL | Status: DC | PRN

## 2016-04-26 ENCOUNTER — Other Ambulatory Visit: Payer: Self-pay

## 2016-04-26 ENCOUNTER — Other Ambulatory Visit: Payer: Self-pay | Admitting: Family Medicine

## 2016-04-26 DIAGNOSIS — E2839 Other primary ovarian failure: Secondary | ICD-10-CM

## 2016-04-30 ENCOUNTER — Ambulatory Visit: Payer: Medicare Other

## 2016-04-30 ENCOUNTER — Other Ambulatory Visit: Payer: Medicare Other

## 2016-05-08 ENCOUNTER — Encounter: Payer: Self-pay | Admitting: Family Medicine

## 2016-05-08 DIAGNOSIS — M810 Age-related osteoporosis without current pathological fracture: Secondary | ICD-10-CM | POA: Insufficient documentation

## 2016-06-03 ENCOUNTER — Telehealth: Payer: Self-pay | Admitting: Internal Medicine

## 2016-06-03 NOTE — Telephone Encounter (Signed)
Used interpreter [phone]  Unable to speak with patient. The phone number in chart is her son's. Asked son to ask patient to make an appointment in clinic to discuss dexa scan results. Son understood.

## 2016-06-06 ENCOUNTER — Encounter: Payer: Self-pay | Admitting: Internal Medicine

## 2016-06-06 ENCOUNTER — Ambulatory Visit (INDEPENDENT_AMBULATORY_CARE_PROVIDER_SITE_OTHER): Payer: Medicare Other | Admitting: Internal Medicine

## 2016-06-06 VITALS — BP 117/86 | HR 63 | Temp 97.8°F | Wt 129.0 lb

## 2016-06-06 DIAGNOSIS — M859 Disorder of bone density and structure, unspecified: Secondary | ICD-10-CM | POA: Diagnosis not present

## 2016-06-06 DIAGNOSIS — M8580 Other specified disorders of bone density and structure, unspecified site: Secondary | ICD-10-CM

## 2016-06-06 MED ORDER — CALCIUM CARBONATE 500 MG PO CHEW: 1000.0000 mg | CHEWABLE_TABLET | Freq: Every day | ORAL | Status: DC

## 2016-06-06 NOTE — Assessment & Plan Note (Addendum)
Discussed diet and lifestyle activities to help prevent osteoporosis - will start on Calcium; Calcium carbonate 1000 mg daily - will obtain Vit D level today and then start on supplements in case patient is Vit D deficient so that we can replete and then start on maintenance therapy.  - will need to be followed by dexa scan about every 3 years

## 2016-06-06 NOTE — Progress Notes (Signed)
Date of Visit: 06/06/2016   HPI:  Osteopenia:  Patient is here to discuss recent dexa scan results - reports she drinks soy milk 2 cups a day (640m Ca), yogurt 1 cup a day (2544m - she walks and does tai chi for 1 hour every day  - we discussed the importance of continuing to do exercise. Patient reports she has a "slipped disc" or some vertebral abnormality in her spine and her physician recommended avoiding lifting heaving objects.   ROS: See HPI.  PMBridgetonNew Diagnosis of Osteopenia  PHYSICAL EXAM: BP 117/86 mmHg  Pulse 63  Temp(Src) 97.8 F (36.6 C) (Oral)  Wt 129 lb (58.514 kg)  SpO2 98% Gen: NAD Neuro: no gross deficits, normal speech, awake and alert  ASSESSMENT/PLAN: Osteopenia Discussed diet and lifestyle activities to help prevent osteoporosis - will start on Calcium; Calcium carbonate 1000 mg daily - will obtain Vit D level today and then start on supplements in case patient is Vit D deficient so that we can replete and then start on maintenance therapy.  - will need to be followed by dexa scan about every 3 years     KaSmiley HousemanMD PGY 1 [place]

## 2016-06-06 NOTE — Patient Instructions (Addendum)
Please take the calcium supplements as prescribed. We will check your vitamin D level today; I will call you with the results.

## 2016-06-07 ENCOUNTER — Telehealth: Payer: Self-pay | Admitting: Internal Medicine

## 2016-06-07 LAB — VITAMIN D 25 HYDROXY (VIT D DEFICIENCY, FRACTURES): Vit D, 25-Hydroxy: 25 ng/mL — ABNORMAL LOW (ref 30–100)

## 2016-06-07 NOTE — Telephone Encounter (Signed)
Tried to call patient to report of Vitamin D insufficiency. Left message on voicemail to call back or that I will also try to contact her again. Initially called her son's phone number (in the chart). Her son gave patient's cell phone number [phone].   Used [place] Interpretter [phone]

## 2016-06-08 ENCOUNTER — Telehealth: Payer: Self-pay | Admitting: Internal Medicine

## 2016-06-08 DIAGNOSIS — E559 Vitamin D deficiency, unspecified: Secondary | ICD-10-CM

## 2016-06-08 DIAGNOSIS — M8580 Other specified disorders of bone density and structure, unspecified site: Secondary | ICD-10-CM

## 2016-06-08 MED ORDER — CHOLECALCIFEROL 10 MCG (400 UNIT) PO CAPS: 800.0000 [IU] | ORAL_CAPSULE | Freq: Every day | ORAL | Status: DC

## 2016-06-08 NOTE — Telephone Encounter (Signed)
Tried to contact patient on her cell phone: [phone]. Left message about vitamin D deficiency; I mentioned that I will send Vitamin D supplement to the pharmacy.  I also called her son who's phone number is the contact number on her chart. He requests that I call patient's best friend [phone] [place]) and allowed to discuss information  So I called Ms. Abigail Riley and reported Vit d insufficiency. Reported that I sent Vitamin D supplement to pharmacy. Recommended getting lab visit in 4 months to check Vitamin D level.

## 2016-09-19 NOTE — Progress Notes (Signed)
   [facility] Phone: [phone]   Date of Visit: 09/20/2016   HPI: - no concerns today   HTN:  - Lisinopril 40mg  - no chest pain, HA, blurred vision, shortness of breath - takes medication daily   Osteopenia/History of Vitamin D Deficiency:  - reports taking Vitamin D, but not in her mediation bag. - is taking calcium supplements    ROS: See HPI.  PMFSH:  Osteopenia HTN Allergic Rhinitis HLD Depression Knee Pain, Bilateral  PHYSICAL EXAM: BP 136/87   Pulse 69   Temp 98.2 F (36.8 C) (Oral)   Wt 131 lb (59.4 kg)   SpO2 100%   BMI 23.96 kg/m  GEN: NAD CV: RRR, no murmurs, rubs, or gallops PULM: CTAB, normal effort SKIN: No rash or cyanosis; warm and well-perfused EXTR: No lower extremity edema or calf tenderness PSYCH: Mood and affect euthymic, normal rate and volume of speech NEURO: Awake, alert, no focal deficits grossly, normal speech   ASSESSMENT/PLAN:  Health maintenance:  - rx for zostavax given to patient   HYPERTENSION, BENIGN SYSTEMIC Stable. Continue Lisinopril.  - BMP today    Osteopenia Vit D level today   Palma HolterKanishka G Siona Coulston, MD PGY 2 [facility]

## 2016-09-20 ENCOUNTER — Ambulatory Visit (INDEPENDENT_AMBULATORY_CARE_PROVIDER_SITE_OTHER): Payer: Medicare Other | Admitting: Internal Medicine

## 2016-09-20 ENCOUNTER — Other Ambulatory Visit: Payer: Self-pay | Admitting: Family Medicine

## 2016-09-20 ENCOUNTER — Encounter: Payer: Self-pay | Admitting: Internal Medicine

## 2016-09-20 VITALS — BP 136/87 | HR 69 | Temp 98.2°F | Wt 131.0 lb

## 2016-09-20 DIAGNOSIS — E559 Vitamin D deficiency, unspecified: Secondary | ICD-10-CM | POA: Diagnosis not present

## 2016-09-20 DIAGNOSIS — Z23 Encounter for immunization: Secondary | ICD-10-CM | POA: Diagnosis not present

## 2016-09-20 DIAGNOSIS — M8580 Other specified disorders of bone density and structure, unspecified site: Secondary | ICD-10-CM | POA: Diagnosis not present

## 2016-09-20 DIAGNOSIS — I10 Essential (primary) hypertension: Secondary | ICD-10-CM | POA: Diagnosis present

## 2016-09-20 MED ORDER — ZOSTER VACCINE LIVE 19400 UNT/0.65ML ~~LOC~~ SUSR: 0.6500 mL | Freq: Once | SUBCUTANEOUS | 0 refills | Status: AC

## 2016-09-20 MED ORDER — ATORVASTATIN CALCIUM 40 MG PO TABS: 40.0000 mg | ORAL_TABLET | Freq: Every day | ORAL | 2 refills | Status: DC

## 2016-09-20 MED ORDER — LISINOPRIL 40 MG PO TABS: 40.0000 mg | ORAL_TABLET | Freq: Every day | ORAL | 2 refills | Status: DC

## 2016-09-20 NOTE — Patient Instructions (Addendum)
Please make a follow up appointment to discuss your hearing We will get lab work today  I provided you with a prescription for your shingles shot.

## 2016-09-20 NOTE — Assessment & Plan Note (Signed)
Stable. Continue Lisinopril.  - BMP today

## 2016-09-20 NOTE — Assessment & Plan Note (Signed)
Vit D level today 

## 2016-09-21 LAB — BASIC METABOLIC PANEL
BUN: 15 mg/dL (ref 7–25)
CHLORIDE: 101 mmol/L (ref 98–110)
CO2: 28 mmol/L (ref 20–31)
Calcium: 10.7 mg/dL — ABNORMAL HIGH (ref 8.6–10.4)
Creat: 0.81 mg/dL (ref 0.50–0.99)
Glucose, Bld: 89 mg/dL (ref 65–99)
POTASSIUM: 4.3 mmol/L (ref 3.5–5.3)
Sodium: 139 mmol/L (ref 135–146)

## 2016-09-21 LAB — VITAMIN D 25 HYDROXY (VIT D DEFICIENCY, FRACTURES): Vit D, 25-Hydroxy: 26 ng/mL — ABNORMAL LOW (ref 30–100)

## 2016-09-23 NOTE — Telephone Encounter (Signed)
This refill request does not have a specific medication

## 2016-10-03 ENCOUNTER — Telehealth: Payer: Self-pay | Admitting: Internal Medicine

## 2016-10-03 DIAGNOSIS — E559 Vitamin D deficiency, unspecified: Secondary | ICD-10-CM

## 2016-10-03 NOTE — Telephone Encounter (Signed)
#   [phone] [place] interpreter. Called [phone]. This is the son's number  who gave the patient's number to call [phone]  Made call to discuss low Vitamin D level ( mildly improved 25 to 26) and diagnosis of Vitamin D insufficiency. Her level has only slightly improved from prior level about 4 months ago. She has been taking a combination pill of Ca-VitD 500mg -400IU BID although in her MAR, the Ca is 1000mg  and Cholecalciferol 800IU separately. She reports she is taking this medication.  We will continue this for now and recheck in 3 months.

## 2017-04-17 DIAGNOSIS — M545 Low back pain: Secondary | ICD-10-CM | POA: Diagnosis not present

## 2017-04-17 DIAGNOSIS — Z79899 Other long term (current) drug therapy: Secondary | ICD-10-CM | POA: Diagnosis not present

## 2017-04-17 DIAGNOSIS — R739 Hyperglycemia, unspecified: Secondary | ICD-10-CM | POA: Diagnosis not present

## 2017-04-17 DIAGNOSIS — M25562 Pain in left knee: Secondary | ICD-10-CM | POA: Diagnosis not present

## 2017-04-21 DIAGNOSIS — M79604 Pain in right leg: Secondary | ICD-10-CM | POA: Diagnosis not present

## 2017-05-01 DIAGNOSIS — D539 Nutritional anemia, unspecified: Secondary | ICD-10-CM | POA: Diagnosis not present

## 2017-05-01 DIAGNOSIS — M545 Low back pain: Secondary | ICD-10-CM | POA: Diagnosis not present

## 2017-05-01 DIAGNOSIS — M25562 Pain in left knee: Secondary | ICD-10-CM | POA: Diagnosis not present

## 2017-05-01 DIAGNOSIS — M4696 Unspecified inflammatory spondylopathy, lumbar region: Secondary | ICD-10-CM | POA: Diagnosis not present

## 2017-05-10 ENCOUNTER — Other Ambulatory Visit: Payer: Self-pay | Admitting: Internal Medicine

## 2017-05-14 DIAGNOSIS — G8929 Other chronic pain: Secondary | ICD-10-CM | POA: Diagnosis not present

## 2017-05-14 DIAGNOSIS — M25562 Pain in left knee: Secondary | ICD-10-CM | POA: Diagnosis not present

## 2017-05-14 DIAGNOSIS — M1712 Unilateral primary osteoarthritis, left knee: Secondary | ICD-10-CM | POA: Diagnosis not present

## 2017-05-14 DIAGNOSIS — M48062 Spinal stenosis, lumbar region with neurogenic claudication: Secondary | ICD-10-CM | POA: Diagnosis not present

## 2017-05-14 DIAGNOSIS — M4696 Unspecified inflammatory spondylopathy, lumbar region: Secondary | ICD-10-CM | POA: Diagnosis not present

## 2017-05-21 NOTE — Progress Notes (Signed)
   [facility] Phone: [phone]   Date of Visit: 05/22/2017   HPI:  Patient is here for follow up for HTN and medication refill: [place] interpreter used [phone]  HTN: - Medications: Lisinopril 40mg  - denies HA, blurred vision, chest pain, shortness of breath - compliant with medications  HLD: - Medications: Atorvastatin 40mg   - denies any RUQ pain or myalgias - compliant with medications  Patient has a history of Vitamin D insufficiency and Osteopenia. Is on Calcium and daily Vitamin D.   ROS: See HPI.  PMFSH:  PMH: HTN Osteopenia Hypercholesterolemia Depression  Allergic Rhinitis    PHYSICAL EXAM: BP 130/78   Pulse 73   Temp 97.8 F (36.6 C) (Oral)   Wt 130 lb (59 kg)   SpO2 94%   BMI 23.78 kg/m  GEN: NAD CV: RRR, no murmurs, rubs, or gallops PULM: CTAB, normal effort ABD: Soft, nontender, nondistended, NABS, no organomegaly SKIN: No rash or cyanosis; warm and well-perfused EXTR: No lower extremity edema or calf tenderness PSYCH: Mood and affect euthymic, normal rate and volume of speech NEURO: Awake, alert, no focal deficits grossly, normal speech   ASSESSMENT/PLAN:  Health maintenance:  - reports she will make an appointment for mammogram today   HYPERTENSION, BENIGN SYSTEMIC BP at goal. Continue Lisinopril. CMP today   HYPERCHOLESTEROLEMIA Continue Atorvastatin. I think we can wait one more year for lipid panel as her last one was fairly good. CMP today to check LFTs.   Vitamin D Insufficiency: - vitamin D level   Follow up 4-6 months   Palma HolterKanishka G Ranier Coach, MD PGY 2 [facility]

## 2017-05-22 ENCOUNTER — Encounter: Payer: Self-pay | Admitting: Internal Medicine

## 2017-05-22 ENCOUNTER — Ambulatory Visit (INDEPENDENT_AMBULATORY_CARE_PROVIDER_SITE_OTHER): Payer: Medicare Other | Admitting: Internal Medicine

## 2017-05-22 ENCOUNTER — Other Ambulatory Visit: Payer: Medicare Other

## 2017-05-22 VITALS — BP 130/78 | HR 73 | Temp 97.8°F | Wt 130.0 lb

## 2017-05-22 DIAGNOSIS — Z1231 Encounter for screening mammogram for malignant neoplasm of breast: Secondary | ICD-10-CM | POA: Diagnosis not present

## 2017-05-22 DIAGNOSIS — I10 Essential (primary) hypertension: Secondary | ICD-10-CM | POA: Diagnosis not present

## 2017-05-22 DIAGNOSIS — E559 Vitamin D deficiency, unspecified: Secondary | ICD-10-CM

## 2017-05-22 DIAGNOSIS — E785 Hyperlipidemia, unspecified: Secondary | ICD-10-CM | POA: Diagnosis not present

## 2017-05-22 DIAGNOSIS — M8580 Other specified disorders of bone density and structure, unspecified site: Secondary | ICD-10-CM | POA: Diagnosis not present

## 2017-05-22 DIAGNOSIS — E7800 Pure hypercholesterolemia, unspecified: Secondary | ICD-10-CM

## 2017-05-22 MED ORDER — LISINOPRIL 40 MG PO TABS: 40.0000 mg | ORAL_TABLET | Freq: Every day | ORAL | 0 refills | Status: DC

## 2017-05-22 MED ORDER — CALCIUM CARBONATE 500 MG PO CHEW: 1000.0000 mg | CHEWABLE_TABLET | Freq: Every day | ORAL | 2 refills | Status: DC

## 2017-05-22 MED ORDER — ATORVASTATIN CALCIUM 40 MG PO TABS: 40.0000 mg | ORAL_TABLET | Freq: Every day | ORAL | 0 refills | Status: DC

## 2017-05-22 MED ORDER — CHOLECALCIFEROL 10 MCG (400 UNIT) PO CAPS: 800.0000 [IU] | ORAL_CAPSULE | Freq: Every day | ORAL | 0 refills | Status: DC

## 2017-05-22 NOTE — Assessment & Plan Note (Signed)
BP at goal. Continue Lisinopril. CMP today

## 2017-05-22 NOTE — Patient Instructions (Signed)
We will get some labs today.

## 2017-05-22 NOTE — Assessment & Plan Note (Signed)
Continue Atorvastatin. I think we can wait one more year for lipid panel as her last one was fairly good. CMP today to check LFTs.

## 2017-05-23 ENCOUNTER — Telehealth: Payer: Self-pay | Admitting: Internal Medicine

## 2017-05-23 LAB — CMP14+EGFR
ALBUMIN: 4.9 g/dL — AB (ref 3.5–4.8)
ALT: 20 IU/L (ref 0–32)
AST: 24 IU/L (ref 0–40)
Albumin/Globulin Ratio: 1.6 (ref 1.2–2.2)
Alkaline Phosphatase: 68 IU/L (ref 39–117)
BUN / CREAT RATIO: 23 (ref 12–28)
BUN: 18 mg/dL (ref 8–27)
Bilirubin Total: 0.3 mg/dL (ref 0.0–1.2)
CALCIUM: 10.2 mg/dL (ref 8.7–10.3)
CO2: 25 mmol/L (ref 20–29)
CREATININE: 0.79 mg/dL (ref 0.57–1.00)
Chloride: 100 mmol/L (ref 96–106)
GFR, EST AFRICAN AMERICAN: 88 mL/min/{1.73_m2} (ref >59)
GFR, EST NON AFRICAN AMERICAN: 76 mL/min/{1.73_m2} (ref >59)
GLUCOSE: 88 mg/dL (ref 65–99)
Globulin, Total: 3 g/dL (ref 1.5–4.5)
Potassium: 4.1 mmol/L (ref 3.5–5.2)
Sodium: 140 mmol/L (ref 134–144)
TOTAL PROTEIN: 7.9 g/dL (ref 6.0–8.5)

## 2017-05-23 LAB — VITAMIN D 25 HYDROXY (VIT D DEFICIENCY, FRACTURES): VIT D 25 HYDROXY: 18.9 ng/mL — AB (ref 30.0–100.0)

## 2017-05-23 NOTE — Telephone Encounter (Signed)
[place] Interpreter: Elijah Birkom [phone]   Attempted to call patient at [phone] but went to voicemail. Left message to call back.   Vitamin D level is low (vitamin D deficiency). I would like her to take Vit D 50,000 IU weekly for 8 weeks then recheck. Her cmp is unremarkable.

## 2017-05-24 DIAGNOSIS — M5127 Other intervertebral disc displacement, lumbosacral region: Secondary | ICD-10-CM | POA: Diagnosis not present

## 2017-05-24 DIAGNOSIS — M48061 Spinal stenosis, lumbar region without neurogenic claudication: Secondary | ICD-10-CM | POA: Diagnosis not present

## 2017-05-27 ENCOUNTER — Encounter: Payer: Self-pay | Admitting: Internal Medicine

## 2017-05-28 ENCOUNTER — Telehealth: Payer: Self-pay | Admitting: Internal Medicine

## 2017-05-28 NOTE — Telephone Encounter (Signed)
Attempted to call patient and emergency contact. Left voicemail to call back on patient's phone. Emergency contact line busy.   Interpreter used: [phone].  Will send a letter asking to call clinic.

## 2017-05-29 DIAGNOSIS — M48062 Spinal stenosis, lumbar region with neurogenic claudication: Secondary | ICD-10-CM | POA: Diagnosis not present

## 2017-06-11 DIAGNOSIS — M5440 Lumbago with sciatica, unspecified side: Secondary | ICD-10-CM | POA: Diagnosis not present

## 2017-06-11 DIAGNOSIS — G8929 Other chronic pain: Secondary | ICD-10-CM | POA: Diagnosis not present

## 2017-06-18 ENCOUNTER — Other Ambulatory Visit: Payer: Self-pay | Admitting: Internal Medicine

## 2017-09-06 DIAGNOSIS — Z23 Encounter for immunization: Secondary | ICD-10-CM | POA: Diagnosis not present

## 2017-09-28 NOTE — Progress Notes (Signed)
   [facility] Phone: [phone]   Date of Visit: 09/30/2017   HPI:  Patient presents today for a well woman exam.  Patient elected to use friend as [place] interpreter. Form signed.   Concerns today: none Periods: Postmenopausal.  No vaginal bleeding or vaginal discharge. Pelvic symptoms: No vaginal bleeding, no pelvic pain, no vaginal discharge. Pap smear status: Her last Pap smear was in 2013 with negative cytology.  Prior to that there is one Pap smear that was noted to be done in 2006 but I cannot see the results of this test.  Per patient's knowledge she has only had one Pap smear in the past.  We discussed that even though cervical cancer screening usually stops at age 70 there needs to have adequate screening prior to this.  Patient understands and would like to proceed with a Pap smear today. Exercise: She exercises regularly for 1 hour daily Diet: Well-rounded diet Smoking: Denies smoking Alcohol: Denies alcohol use Drugs: Denies other drug use. Mood: PHQ 2 was negative Dentist: reports she needs to go soon.   HTN:  - medications: reports that she had ran out of Lisinopril about 1 week ago. Her daughter gave a a medication from [place] instead. The bottle has Perindorpil 5mg  on label.   ROS:  Review of Systems  Constitutional: Negative for chills, fever, malaise/fatigue and weight loss.  HENT: Negative for hearing loss, sore throat and tinnitus.   Eyes: Negative for blurred vision.  Respiratory: Negative for cough and shortness of breath.   Cardiovascular: Negative for chest pain and leg swelling.  Gastrointestinal: Negative for abdominal pain, constipation, diarrhea, nausea and vomiting.  Genitourinary: Negative for dysuria and frequency.  Musculoskeletal: Negative for falls, joint pain and myalgias.  Skin: Negative for itching and rash.  Neurological: Negative for dizziness, sensory change, focal weakness and headaches.    Psychiatric/Behavioral: Negative for depression and substance abuse. The patient is not nervous/anxious.    PMFSH:  PMH: HTN Allergic Rhinitis Osteopenia HLD Depression Bilateral Knee Pain Insomnia  Cancers in family: denies any cancer in the family   PHYSICAL EXAM: BP (!) 142/83   Pulse 93   Temp 98.1 F (36.7 C) (Oral)   Wt 130 lb (59 kg)   SpO2 95%   BMI 23.78 kg/m  Gen: NAD, pleasant, cooperative HEENT: NCAT, PERRL, no palpable thyromegaly or anterior cervical lymphadenopathy Heart: RRR, no murmurs Lungs: CTAB, NWOB Abdomen: soft, nontender to palpation Neuro: grossly nonfocal, speech normal GU: normal appearing external genitalia without lesions. Vagina is moist with white discharge. Cervix normal in appearance. No cervical motion tenderness or tenderness on bimanual exam. No adnexal masses.   ASSESSMENT/PLAN:  # Health maintenance:  -pap smear: completed today  -mammogram: up to date -lipid screening: completed today  -DEXA: osteopenia on DEXA in 04/2016. Will need repeat in 2019 -immunizations: up to date  - colonoscopy: reports of colonoscopy completed in 2015. We do not have records of this. Will need to look into this.   Vitamin D Deficiency: Vitamin D level today   HTN/HLD: CMP today   Palma HolterKanishka G Gunadasa, MD PGY 3 [facility]

## 2017-09-30 ENCOUNTER — Encounter: Payer: Self-pay | Admitting: Internal Medicine

## 2017-09-30 ENCOUNTER — Other Ambulatory Visit (HOSPITAL_COMMUNITY)
Admission: RE | Admit: 2017-09-30 | Discharge: 2017-09-30 | Disposition: A | Discharge status: Home / Self Care | Payer: Medicare Other | Source: Ambulatory Visit | Attending: Family Medicine | Admitting: Family Medicine

## 2017-09-30 ENCOUNTER — Other Ambulatory Visit: Payer: Self-pay

## 2017-09-30 ENCOUNTER — Ambulatory Visit (INDEPENDENT_AMBULATORY_CARE_PROVIDER_SITE_OTHER): Payer: Medicare Other | Admitting: Internal Medicine

## 2017-09-30 VITALS — BP 142/83 | HR 93 | Temp 98.1°F | Wt 130.0 lb

## 2017-09-30 DIAGNOSIS — E785 Hyperlipidemia, unspecified: Secondary | ICD-10-CM | POA: Diagnosis not present

## 2017-09-30 DIAGNOSIS — Z124 Encounter for screening for malignant neoplasm of cervix: Secondary | ICD-10-CM

## 2017-09-30 DIAGNOSIS — I10 Essential (primary) hypertension: Secondary | ICD-10-CM | POA: Diagnosis not present

## 2017-09-30 DIAGNOSIS — Z0000 Encounter for general adult medical examination without abnormal findings: Secondary | ICD-10-CM | POA: Diagnosis not present

## 2017-09-30 DIAGNOSIS — E559 Vitamin D deficiency, unspecified: Secondary | ICD-10-CM

## 2017-09-30 MED ORDER — ATORVASTATIN CALCIUM 40 MG PO TABS: 40.0000 mg | ORAL_TABLET | Freq: Every day | ORAL | 0 refills | Status: DC

## 2017-09-30 MED ORDER — CALCIUM CARBONATE 500 MG PO CHEW: 1000.0000 mg | CHEWABLE_TABLET | Freq: Every day | ORAL | 2 refills | Status: DC

## 2017-09-30 MED ORDER — TRAZODONE HCL 50 MG PO TABS: 25.0000 mg | ORAL_TABLET | Freq: Every evening | ORAL | 0 refills | Status: DC | PRN

## 2017-09-30 MED ORDER — LISINOPRIL 40 MG PO TABS: 40.0000 mg | ORAL_TABLET | Freq: Every day | ORAL | 0 refills | Status: DC

## 2017-09-30 NOTE — Patient Instructions (Addendum)
Thank you for coming in. We will get some blood work today.   I will call you with the results. I sent prescriptions to the pharmacy.

## 2017-10-01 LAB — CMP14+EGFR
A/G RATIO: 1.6 (ref 1.2–2.2)
ALBUMIN: 4.6 g/dL (ref 3.5–4.8)
ALT: 20 IU/L (ref 0–32)
AST: 28 IU/L (ref 0–40)
Alkaline Phosphatase: 68 IU/L (ref 39–117)
BILIRUBIN TOTAL: 0.4 mg/dL (ref 0.0–1.2)
BUN / CREAT RATIO: 17 (ref 12–28)
BUN: 12 mg/dL (ref 8–27)
CO2: 27 mmol/L (ref 20–29)
CREATININE: 0.69 mg/dL (ref 0.57–1.00)
Calcium: 9.6 mg/dL (ref 8.7–10.3)
Chloride: 102 mmol/L (ref 96–106)
GFR calc Af Amer: 102 mL/min/{1.73_m2} (ref >59)
GFR calc non Af Amer: 88 mL/min/{1.73_m2} (ref >59)
GLOBULIN, TOTAL: 2.8 g/dL (ref 1.5–4.5)
Glucose: 62 mg/dL — ABNORMAL LOW (ref 65–99)
POTASSIUM: 3.4 mmol/L — AB (ref 3.5–5.2)
SODIUM: 144 mmol/L (ref 134–144)
Total Protein: 7.4 g/dL (ref 6.0–8.5)

## 2017-10-01 LAB — LIPID PANEL
CHOL/HDL RATIO: 2.9 ratio (ref 0.0–4.4)
CHOLESTEROL TOTAL: 153 mg/dL (ref 100–199)
HDL: 53 mg/dL (ref >39)
LDL CALC: 83 mg/dL (ref 0–99)
Triglycerides: 86 mg/dL (ref 0–149)
VLDL Cholesterol Cal: 17 mg/dL (ref 5–40)

## 2017-10-01 LAB — VITAMIN D 25 HYDROXY (VIT D DEFICIENCY, FRACTURES): Vit D, 25-Hydroxy: 33.1 ng/mL (ref 30.0–100.0)

## 2017-10-02 LAB — CYTOLOGY - PAP
Diagnosis: NEGATIVE
HPV: NOT DETECTED

## 2017-10-03 ENCOUNTER — Encounter: Payer: Self-pay | Admitting: Internal Medicine

## 2017-10-03 ENCOUNTER — Telehealth: Payer: Self-pay | Admitting: Internal Medicine

## 2017-10-03 DIAGNOSIS — E559 Vitamin D deficiency, unspecified: Secondary | ICD-10-CM

## 2017-10-03 DIAGNOSIS — M8580 Other specified disorders of bone density and structure, unspecified site: Secondary | ICD-10-CM

## 2017-10-03 MED ORDER — CHOLECALCIFEROL 10 MCG (400 UNIT) PO CAPS: 800.0000 [IU] | ORAL_CAPSULE | Freq: Every day | ORAL | 3 refills | Status: DC

## 2017-10-03 MED ORDER — TRAZODONE HCL 50 MG PO TABS: 25.0000 mg | ORAL_TABLET | Freq: Every evening | ORAL | 0 refills | Status: DC | PRN

## 2017-10-03 MED ORDER — ATORVASTATIN CALCIUM 40 MG PO TABS: 40.0000 mg | ORAL_TABLET | Freq: Every day | ORAL | 2 refills | Status: DC

## 2017-10-03 MED ORDER — LISINOPRIL 40 MG PO TABS: 40.0000 mg | ORAL_TABLET | Freq: Every day | ORAL | 1 refills | Status: DC

## 2017-10-03 NOTE — Progress Notes (Signed)
Sent Letter regarding normal labs

## 2017-10-03 NOTE — Telephone Encounter (Signed)
[place] Interpreter: [phone]  Interpreter called the home phone number. She reports that someone answered the phone and then hung up. So we left a message.   Interpreter called the "other phone" number listed. Left message.   Attempted to get in contact with patient to report of normal labs from our recent visit. On third attempt, able to speak with patient. Reports of results.   Questions answered.   She also mentioned BP 134/93 HR 84. Continue current management. She also requests refills on the medications since her friend who usually interprets for her is going to [place].

## 2018-05-19 ENCOUNTER — Other Ambulatory Visit: Payer: Self-pay

## 2018-05-19 ENCOUNTER — Ambulatory Visit (INDEPENDENT_AMBULATORY_CARE_PROVIDER_SITE_OTHER): Payer: Medicare Other | Admitting: Family Medicine

## 2018-05-19 VITALS — BP 128/70 | HR 77 | Temp 98.0°F | Ht <= 58 in | Wt 133.0 lb

## 2018-05-19 DIAGNOSIS — I10 Essential (primary) hypertension: Secondary | ICD-10-CM | POA: Diagnosis not present

## 2018-05-19 DIAGNOSIS — Z0000 Encounter for general adult medical examination without abnormal findings: Secondary | ICD-10-CM | POA: Diagnosis not present

## 2018-05-19 DIAGNOSIS — Z780 Asymptomatic menopausal state: Secondary | ICD-10-CM

## 2018-05-19 DIAGNOSIS — E2839 Other primary ovarian failure: Secondary | ICD-10-CM

## 2018-05-19 DIAGNOSIS — E7800 Pure hypercholesterolemia, unspecified: Secondary | ICD-10-CM

## 2018-05-19 DIAGNOSIS — Z1239 Encounter for other screening for malignant neoplasm of breast: Secondary | ICD-10-CM

## 2018-05-19 DIAGNOSIS — M8580 Other specified disorders of bone density and structure, unspecified site: Secondary | ICD-10-CM | POA: Diagnosis not present

## 2018-05-19 DIAGNOSIS — E559 Vitamin D deficiency, unspecified: Secondary | ICD-10-CM

## 2018-05-19 DIAGNOSIS — Z1231 Encounter for screening mammogram for malignant neoplasm of breast: Secondary | ICD-10-CM | POA: Diagnosis not present

## 2018-05-19 MED ORDER — CHOLECALCIFEROL 10 MCG (400 UNIT) PO CAPS: 800.0000 [IU] | ORAL_CAPSULE | Freq: Every day | ORAL | 3 refills | Status: DC

## 2018-05-19 MED ORDER — LISINOPRIL 40 MG PO TABS: 40.0000 mg | ORAL_TABLET | Freq: Every day | ORAL | 2 refills | Status: DC

## 2018-05-19 MED ORDER — TRAZODONE HCL 50 MG PO TABS: 25.0000 mg | ORAL_TABLET | Freq: Every evening | ORAL | 0 refills | Status: DC | PRN

## 2018-05-19 MED ORDER — ATORVASTATIN CALCIUM 40 MG PO TABS: 40.0000 mg | ORAL_TABLET | Freq: Every day | ORAL | 2 refills | Status: DC

## 2018-05-19 MED ORDER — HYPROMELLOSE (GONIOSCOPIC) 2.5 % OP SOLN: 1.0000 [drp] | OPHTHALMIC | 12 refills | Status: DC | PRN

## 2018-05-19 MED ORDER — CALCIUM CARBONATE 500 MG PO CHEW: 1000.0000 mg | CHEWABLE_TABLET | Freq: Every day | ORAL | 2 refills | Status: DC

## 2018-05-19 NOTE — Assessment & Plan Note (Signed)
-  pap smear: UTD -mammogram: ordered this visit -lipid screening: collected this visit -DEXA: Ordered and patient to obtain -immunizations: UTD -advance directives: patient refused discussion or handout  -handout given on health maintenance topics

## 2018-05-19 NOTE — Assessment & Plan Note (Signed)
Lipid Panel collected today and Lipitor refilled today

## 2018-05-19 NOTE — Progress Notes (Addendum)
   Subjective:    Patient ID: Abigail Riley, female    DOB: March 04, 1946, 72 y.o.   MRN: [id number]   CC: physical  HPI:  Patient presents today for a well woman exam. Her friend is her interpreter for this visit, and a form was signed.   Concerns today: none Periods: no Pap smear status: up to date Exercise: walks for an hour every morning and 30 minutes every evening Diet: tries to eat very healthy Smoking: never Alcohol: no Drugs: never Advance directives: not interested in handout or discussion at today's visit, although offered Mood: happy Does not have an eye doctor and reports no trouble with vision, and would not like a referral at this time  ROS: See HPI  Smoking status reviewed  Patient Active Problem List   Diagnosis Date Noted  . Osteopenia 05/08/2016  . Insomnia 12/28/2013  . Healthcare maintenance 03/06/2012  . DEPRESSION 08/16/2010  . ALLERGIC RHINITIS WITH CONJUNCTIVITIS 04/02/2010  . KNEE PAIN, BILATERAL 07/07/2009  . HYPERCHOLESTEROLEMIA 01/15/2007  . HYPERTENSION, BENIGN SYSTEMIC 01/15/2007     Objective:  BP 128/70   Pulse 77   Temp 98 F (36.7 C) (Oral)   Ht 4\' 9"  (1.448 m)   Wt 133 lb (60.3 kg)   SpO2 96%   BMI 28.78 kg/m  Vitals and nursing note reviewed  General: NAD, pleasant Cardiac: RRR, normal heart sounds, no murmurs Respiratory: CTAB, normal effort Abdomen: soft, nontender, nondistended Extremities: no edema or cyanosis. WWP. Skin: warm and dry, no rashes noted Neuro: alert and oriented, no focal deficits Psych: normal affect  Assessment & Plan:   ASSESSMENT/PLAN:  HYPERCHOLESTEROLEMIA Lipid Panel collected today and Lipitor refilled today  HYPERTENSION, BENIGN SYSTEMIC BP 128/70 today. Refilled patient's lisinopril, collecting CMP.   Healthcare maintenance -pap smear: UTD -mammogram: ordered this visit -lipid screening: collected this visit -DEXA: Ordered and patient to obtain -immunizations: UTD -advance directives:  patient refused discussion or handout  -handout given on health maintenance topics  Osteopenia Repeat DEXA as it has been 2 years, will continue with calcium and vitamin D, patient reports compliance  [place] Damarian Priola, DO Family Medicine Resident PGY-2

## 2018-05-19 NOTE — Assessment & Plan Note (Signed)
Repeat DEXA as it has been 2 years, will continue with calcium and vitamin D, patient reports compliance

## 2018-05-19 NOTE — Patient Instructions (Signed)
Thank you for coming to see me today. It was a pleasure! Today we talked about:   Your health maintenance. I have refilled all your medications. I have ordered a DEXA bone scan and mammogram. We will call you with our lab results.   Please follow-up with me in 1 year or sooner as needed.  If you have any questions or concerns, please do not hesitate to call the office at [phone].  Take Care,   [place] Nidya Bouyer, DO Health Maintenance, Female Adopting a healthy lifestyle and getting preventive care can go a long way to promote health and wellness. Talk with your health care provider about what schedule of regular examinations is right for you. This is a good chance for you to check in with your provider about disease prevention and staying healthy. In between checkups, there are plenty of things you can do on your own. Experts have done a lot of research about which lifestyle changes and preventive measures are most likely to keep you healthy. Ask your health care provider for more information. Weight and diet Eat a healthy diet  Be sure to include plenty of vegetables, fruits, low-fat dairy products, and lean protein.  Do not eat a lot of foods high in solid fats, added sugars, or salt.  Get regular exercise. This is one of the most important things you can do for your health. ? Most adults should exercise for at least 150 minutes each week. The exercise should increase your heart rate and make you sweat (moderate-intensity exercise). ? Most adults should also do strengthening exercises at least twice a week. This is in addition to the moderate-intensity exercise.  Maintain a healthy weight  Body mass index (BMI) is a measurement that can be used to identify possible weight problems. It estimates body fat based on height and weight. Your health care provider can help determine your BMI and help you achieve or maintain a healthy weight.  For females 40 years of age and older: ? A BMI  below 18.5 is considered underweight. ? A BMI of 18.5 to 24.9 is normal. ? A BMI of 25 to 29.9 is considered overweight. ? A BMI of 30 and above is considered obese.  Watch levels of cholesterol and blood lipids  You should start having your blood tested for lipids and cholesterol at 71 years of age, then have this test every 5 years.  You may need to have your cholesterol levels checked more often if: ? Your lipid or cholesterol levels are high. ? You are older than 71 years of age. ? You are at high risk for heart disease.  Cancer screening  Breast Cancer  Practice breast self-awareness. This means understanding how your breasts normally appear and feel.  It also means doing regular breast self-exams. Let your health care provider know about any changes, no matter how small.  If you are in your 20s or 30s, you should have a clinical breast exam (CBE) by a health care provider every 1-3 years as part of a regular health exam.  If you are 66 or older, have a CBE every year. Also consider having a breast X-ray (mammogram) every year.  If you have a family history of breast cancer, talk to your health care provider about genetic screening.  If you are at high risk for breast cancer, talk to your health care provider about having an MRI and a mammogram every year.  Breast cancer gene (BRCA) assessment is recommended for women  who have family members with BRCA-related cancers. BRCA-related cancers include: ? Breast. ? Ovarian. ? Tubal. ? Peritoneal cancers.  Results of the assessment will determine the need for genetic counseling and BRCA1 and BRCA2 testing.  Cervical Cancer Your health care provider may recommend that you be screened regularly for cancer of the pelvic organs (ovaries, uterus, and vagina). This screening involves a pelvic examination, including checking for microscopic changes to the surface of your cervix (Pap test). You may be encouraged to have this screening  done every 3 years, beginning at age 46.  For women ages 32-65, health care providers may recommend pelvic exams and Pap testing every 3 years, or they may recommend the Pap and pelvic exam, combined with testing for human papilloma virus (HPV), every 5 years. Some types of HPV increase your risk of cervical cancer. Testing for HPV may also be done on women of any age with unclear Pap test results.  Other health care providers may not recommend any screening for nonpregnant women who are considered low risk for pelvic cancer and who do not have symptoms. Ask your health care provider if a screening pelvic exam is right for you.  If you have had past treatment for cervical cancer or a condition that could lead to cancer, you need Pap tests and screening for cancer for at least 20 years after your treatment. If Pap tests have been discontinued, your risk factors (such as having a new sexual partner) need to be reassessed to determine if screening should resume. Some women have medical problems that increase the chance of getting cervical cancer. In these cases, your health care provider may recommend more frequent screening and Pap tests.  Colorectal Cancer  This type of cancer can be detected and often prevented.  Routine colorectal cancer screening usually begins at 71 years of age and continues through 71 years of age.  Your health care provider may recommend screening at an earlier age if you have risk factors for colon cancer.  Your health care provider may also recommend using home test kits to check for hidden blood in the stool.  A small camera at the end of a tube can be used to examine your colon directly (sigmoidoscopy or colonoscopy). This is done to check for the earliest forms of colorectal cancer.  Routine screening usually begins at age 18.  Direct examination of the colon should be repeated every 5-10 years through 71 years of age. However, you may need to be screened more often  if early forms of precancerous polyps or small growths are found.  Skin Cancer  Check your skin from head to toe regularly.  Tell your health care provider about any new moles or changes in moles, especially if there is a change in a mole's shape or color.  Also tell your health care provider if you have a mole that is larger than the size of a pencil eraser.  Always use sunscreen. Apply sunscreen liberally and repeatedly throughout the day.  Protect yourself by wearing long sleeves, pants, a wide-brimmed hat, and sunglasses whenever you are outside.  Heart disease, diabetes, and high blood pressure  High blood pressure causes heart disease and increases the risk of stroke. High blood pressure is more likely to develop in: ? People who have blood pressure in the high end of the normal range (130-139/85-89 mm Hg). ? People who are overweight or obese. ? People who are African American.  If you are 62-45 years of age, have  your blood pressure checked every 3-5 years. If you are 69 years of age or older, have your blood pressure checked every year. You should have your blood pressure measured twice-once when you are at a hospital or clinic, and once when you are not at a hospital or clinic. Record the average of the two measurements. To check your blood pressure when you are not at a hospital or clinic, you can use: ? An automated blood pressure machine at a pharmacy. ? A home blood pressure monitor.  If you are between 88 years and 67 years old, ask your health care provider if you should take aspirin to prevent strokes.  Have regular diabetes screenings. This involves taking a blood sample to check your fasting blood sugar level. ? If you are at a normal weight and have a low risk for diabetes, have this test once every three years after 71 years of age. ? If you are overweight and have a high risk for diabetes, consider being tested at a younger age or more often. Preventing  infection Hepatitis B  If you have a higher risk for hepatitis B, you should be screened for this virus. You are considered at high risk for hepatitis B if: ? You were born in a country where hepatitis B is common. Ask your health care provider which countries are considered high risk. ? Your parents were born in a high-risk country, and you have not been immunized against hepatitis B (hepatitis B vaccine). ? You have HIV or AIDS. ? You use needles to inject street drugs. ? You live with someone who has hepatitis B. ? You have had sex with someone who has hepatitis B. ? You get hemodialysis treatment. ? You take certain medicines for conditions, including cancer, organ transplantation, and autoimmune conditions.  Hepatitis C  Blood testing is recommended for: ? Everyone born from [birth year] through 1964. ? Anyone with known risk factors for hepatitis C.  Sexually transmitted infections (STIs)  You should be screened for sexually transmitted infections (STIs) including gonorrhea and chlamydia if: ? You are sexually active and are younger than 71 years of age. ? You are older than 71 years of age and your health care provider tells you that you are at risk for this type of infection. ? Your sexual activity has changed since you were last screened and you are at an increased risk for chlamydia or gonorrhea. Ask your health care provider if you are at risk.  If you do not have HIV, but are at risk, it may be recommended that you take a prescription medicine daily to prevent HIV infection. This is called pre-exposure prophylaxis (PrEP). You are considered at risk if: ? You are sexually active and do not regularly use condoms or know the HIV status of your partner(s). ? You take drugs by injection. ? You are sexually active with a partner who has HIV.  Osteoporosis and menopause  Osteoporosis is a disease in which the bones lose minerals and strength with aging. This can result in serious bone  fractures. Your risk for osteoporosis can be identified using a bone density scan.  If you are 17 years of age or older, or if you are at risk for osteoporosis and fractures, ask your health care provider if you should be screened.  Ask your health care provider whether you should take a calcium or vitamin D supplement to lower your risk for osteoporosis.  Menopause may have certain physical symptoms and risks.  Hormone replacement therapy may reduce some of these symptoms and risks. Talk to your health care provider about whether hormone replacement therapy is right for you. Follow these instructions at home:  Schedule regular health, dental, and eye exams.  Stay current with your immunizations.  Do not use any tobacco products including cigarettes, chewing tobacco, or electronic cigarettes.  If you are pregnant, do not drink alcohol.  If you are breastfeeding, limit how much and how often you drink alcohol.  Limit alcohol intake to no more than 1 drink per day for nonpregnant women. One drink equals 12 ounces of beer, 5 ounces of wine, or 1 ounces of hard liquor.  Do not use street drugs.  Do not share needles.  Ask your health care provider for help if you need support or information about quitting drugs.  Tell your health care provider if you often feel depressed.  Tell your health care provider if you have ever been abused or do not feel safe at home. This information is not intended to replace advice given to you by your health care provider. Make sure you discuss any questions you have with your health care provider. Document Released: 05/20/2011 Document Revised: 04/11/2016 Document Reviewed: 08/08/2015 Elsevier Interactive Patient Education  [employer].

## 2018-05-19 NOTE — Assessment & Plan Note (Signed)
BP 128/70 today. Refilled patient's lisinopril, collecting CMP.

## 2018-05-20 LAB — LIPID PANEL
CHOLESTEROL TOTAL: 170 mg/dL (ref 100–199)
Chol/HDL Ratio: 3.5 ratio (ref 0.0–4.4)
HDL: 48 mg/dL (ref >39)
LDL Calculated: 79 mg/dL (ref 0–99)
TRIGLYCERIDES: 216 mg/dL — AB (ref 0–149)
VLDL CHOLESTEROL CAL: 43 mg/dL — AB (ref 5–40)

## 2018-05-20 LAB — CMP14+EGFR
ALBUMIN: 4.5 g/dL (ref 3.5–4.8)
ALK PHOS: 72 IU/L (ref 39–117)
ALT: 18 IU/L (ref 0–32)
AST: 24 IU/L (ref 0–40)
Albumin/Globulin Ratio: 1.5 (ref 1.2–2.2)
BILIRUBIN TOTAL: 0.2 mg/dL (ref 0.0–1.2)
BUN / CREAT RATIO: 19 (ref 12–28)
BUN: 17 mg/dL (ref 8–27)
CHLORIDE: 103 mmol/L (ref 96–106)
CO2: 26 mmol/L (ref 20–29)
Calcium: 10 mg/dL (ref 8.7–10.3)
Creatinine, Ser: 0.88 mg/dL (ref 0.57–1.00)
GFR calc Af Amer: 76 mL/min/{1.73_m2} (ref >59)
GFR calc non Af Amer: 66 mL/min/{1.73_m2} (ref >59)
GLUCOSE: 87 mg/dL (ref 65–99)
Globulin, Total: 3 g/dL (ref 1.5–4.5)
Potassium: 4.3 mmol/L (ref 3.5–5.2)
Sodium: 143 mmol/L (ref 134–144)
Total Protein: 7.5 g/dL (ref 6.0–8.5)

## 2018-05-20 LAB — VITAMIN D 25 HYDROXY (VIT D DEFICIENCY, FRACTURES): Vit D, 25-Hydroxy: 21.6 ng/mL — ABNORMAL LOW (ref 30.0–100.0)

## 2018-05-26 ENCOUNTER — Encounter: Payer: Self-pay | Admitting: Family Medicine

## 2018-06-13 ENCOUNTER — Other Ambulatory Visit: Payer: Self-pay | Admitting: Internal Medicine

## 2018-06-16 ENCOUNTER — Other Ambulatory Visit: Payer: Self-pay | Admitting: Family Medicine

## 2018-07-03 ENCOUNTER — Other Ambulatory Visit: Payer: Self-pay | Admitting: Family Medicine

## 2018-07-03 ENCOUNTER — Telehealth: Payer: Self-pay | Admitting: *Deleted

## 2018-07-03 DIAGNOSIS — M8580 Other specified disorders of bone density and structure, unspecified site: Secondary | ICD-10-CM

## 2018-07-03 DIAGNOSIS — Z780 Asymptomatic menopausal state: Secondary | ICD-10-CM

## 2018-07-03 NOTE — Telephone Encounter (Signed)
Spoke with Talmage CoinMary Lieu ok per DPR and informed her of change in order and she will call them to schedule this appt.  Order faxed to [facility].  Crissie Aloi,CMA

## 2018-07-03 NOTE — Telephone Encounter (Signed)
Pt had the original bone density done @ [facility].  Breast center recommends that she have her next one done @ [facility] as well.  So they have something to compare it to.  She will cancel appt on Monday.  Please get pt scheduled for [facility]. Leylanie Woodmansee, Maryjo RochesterJessica Dawn, CMA

## 2018-07-06 ENCOUNTER — Ambulatory Visit: Payer: Medicare Other

## 2018-07-06 ENCOUNTER — Other Ambulatory Visit: Payer: Medicare Other

## 2018-07-10 DIAGNOSIS — M810 Age-related osteoporosis without current pathological fracture: Secondary | ICD-10-CM | POA: Diagnosis not present

## 2018-07-10 DIAGNOSIS — Z1231 Encounter for screening mammogram for malignant neoplasm of breast: Secondary | ICD-10-CM | POA: Diagnosis not present

## 2018-07-10 DIAGNOSIS — M8589 Other specified disorders of bone density and structure, multiple sites: Secondary | ICD-10-CM | POA: Diagnosis not present

## 2018-07-15 ENCOUNTER — Encounter: Payer: Self-pay | Admitting: Family Medicine

## 2018-07-15 ENCOUNTER — Other Ambulatory Visit: Payer: Self-pay | Admitting: Family Medicine

## 2018-07-21 MED ORDER — ALENDRONATE SODIUM 70 MG PO TABS: 70.0000 mg | ORAL_TABLET | ORAL | 1 refills | Status: DC

## 2018-07-21 NOTE — Progress Notes (Signed)
Patient will need to start bisphosphonate therapy for her recently diagnosed osteoporosis. She is to continue her current Calcium and vitamin D supplementation.

## 2018-07-22 ENCOUNTER — Telehealth: Payer: Self-pay | Admitting: *Deleted

## 2018-07-22 NOTE — Telephone Encounter (Signed)
Spoke with Talmage Coin who is on patient's DPR form.  She voiced understanding on results from Dexa scan as well as the treatment.  Chi Woodham,CMA

## 2018-07-22 NOTE — Telephone Encounter (Signed)
-----   Message from [place] Shirley, DO sent at 07/21/2018  9:34 AM EDT ----- Would you be able to call this patient? I've attempted to reach patient's emergency contact who speaks English multiple times. I have been unsuccessful, and did not want to send a letter since patient does not speak english.   The patient needs to start on a bisphosphonate for her new osteoporosis, seen on her recent DEXA scan. She will need to take Fosamax once weekly to help with her bones. I have sent this to her pharmacy. Please let me know if you are able to reach her and if they have questions.   Thank you!

## 2018-08-07 ENCOUNTER — Encounter: Payer: Self-pay | Admitting: Family Medicine

## 2018-08-31 DIAGNOSIS — Z1211 Encounter for screening for malignant neoplasm of colon: Secondary | ICD-10-CM | POA: Diagnosis not present

## 2018-09-19 ENCOUNTER — Other Ambulatory Visit: Payer: Self-pay | Admitting: Internal Medicine

## 2018-09-19 ENCOUNTER — Other Ambulatory Visit: Payer: Self-pay | Admitting: Family Medicine

## 2018-10-20 DIAGNOSIS — Z23 Encounter for immunization: Secondary | ICD-10-CM | POA: Diagnosis not present

## 2018-11-17 DIAGNOSIS — H40033 Anatomical narrow angle, bilateral: Secondary | ICD-10-CM | POA: Diagnosis not present

## 2018-11-17 DIAGNOSIS — H2513 Age-related nuclear cataract, bilateral: Secondary | ICD-10-CM | POA: Diagnosis not present

## 2018-12-16 ENCOUNTER — Other Ambulatory Visit: Payer: Self-pay | Admitting: Family Medicine

## 2019-01-21 ENCOUNTER — Encounter: Payer: Self-pay | Admitting: Family Medicine

## 2019-01-21 ENCOUNTER — Ambulatory Visit (INDEPENDENT_AMBULATORY_CARE_PROVIDER_SITE_OTHER): Payer: Medicare Other | Admitting: Family Medicine

## 2019-01-21 VITALS — BP 125/80 | HR 81 | Temp 98.0°F | Wt 125.0 lb

## 2019-01-21 DIAGNOSIS — R634 Abnormal weight loss: Secondary | ICD-10-CM

## 2019-01-21 DIAGNOSIS — F99 Mental disorder, not otherwise specified: Secondary | ICD-10-CM

## 2019-01-21 DIAGNOSIS — F5105 Insomnia due to other mental disorder: Secondary | ICD-10-CM

## 2019-01-21 DIAGNOSIS — R030 Elevated blood-pressure reading, without diagnosis of hypertension: Secondary | ICD-10-CM | POA: Diagnosis not present

## 2019-01-21 DIAGNOSIS — Z114 Encounter for screening for human immunodeficiency virus [HIV]: Secondary | ICD-10-CM | POA: Diagnosis not present

## 2019-01-21 DIAGNOSIS — M810 Age-related osteoporosis without current pathological fracture: Secondary | ICD-10-CM | POA: Diagnosis not present

## 2019-01-21 DIAGNOSIS — R6881 Early satiety: Secondary | ICD-10-CM

## 2019-01-21 MED ORDER — MIRTAZAPINE 7.5 MG PO TABS: 7.5000 mg | ORAL_TABLET | Freq: Every day | ORAL | 3 refills | Status: DC

## 2019-01-21 NOTE — Patient Instructions (Signed)
   It was wonderful to see you today.  Thank you for choosing [facility].   Please call [phone] with any questions about today's appointment.  Please be sure to schedule follow up at the front  desk before you leave today.   Terisa Starr, MD  Family Medicine   Schedule follow up in 1 month with Dr. Talbert Forest  Go to the lab today  I will send you a letter or call with results

## 2019-01-21 NOTE — Progress Notes (Signed)
Patient Name: Abigail Riley Date of Birth: April 26, 1946 Date of Visit: 01/21/19 PCP: Shirley, [place], DO  Chief Complaint: Weight loss and headaches  Subjective: Abigail Riley is a pleasant 73 y.o. with a medical history significant for osteoporosis and hypertension presenting today with her sister for weight loss.  The patient and her sister declined an interpreter.  The patient is primarily [place] speaking.  I offered interpreter multiple times throughout the appointment.  The patient reports a one-month history of poor sleep, weight loss, poor appetite and headaches.  She reports she has had this type of feeling before.  She sometimes feels low in the winter and has a poor appetite.  She has had a documented 8 pound weight loss.  She additionally when asked endorses headaches that are worse when she is lying down as opposed to waking up.  She denies nausea, headaches which awaken her from sleep, chest pain, dyspnea, diarrhea, constipation, melena or hematochezia.  She reports she becomes full earlier than usual.  She is a small amount and that is immediately full.  She has not changed her diet at all in terms of consistency.  She she is able to afford her food.  She is able to chew her food.  She has no pain with swallowing or difficulty with swallowing.  She lives with her son.  She does not think she is depressed but does think that her sleep is related to a low mood which is different.  The patient last underwent a mammography in 2018.  She has never had a colonoscopy on file.  She is a non-smoker.  She does not drink any alcohol.  ROS:  As above  ROS  I have reviewed the patient's medical, surgical, family, and social history as appropriate.   Vitals:   01/21/19 1009  BP: 125/80  Pulse: 81  Temp: 98 F (36.7 C)  SpO2: 96%   Filed Weights   01/21/19 1009  Weight: 125 lb (56.7 kg)   HEENT: Sclera anicteric. Dentition is very poor missing numerous teeth. Appears well hydrated. Neck:  Supple no adenopathy Cardiac: Regular rate and rhythm. Normal S1/S2. No murmurs, rubs, or gallops appreciated. Lungs: Clear bilaterally to ascultation.  Abdomen: Normoactive bowel sounds. No tenderness to deep or light palpation. No rebound or guarding.  No fluid wave appreciated Extremities: Warm, trace edema bilaterally Skin: Warm, dry Psych: Pleasant and appropriate     Sukanya was seen today for weight loss and fever.  Diagnoses and all orders for this visit:  Loss of weight, differential remains broad.  I recommended routine cancer screening for her age.  She adamantly declined colonoscopy.  Given her headaches I also recommended an MRI of the brain.  We discussed the possible risks and missing a diagnosis of malignancy.  The patient reports she is sure she does not have cancer and does not want to know if she did.  She would like a medication to help her gain weight.  We discussed the limitations of these medications and adverse side effects.  She has a documented history of depression.  Given her poor appetite and poor sleep will try a low-dose of mirtazapine.  I reviewed that this medication has not been documented to help her gain her weight back or improve her symptoms if this is due to malignancy.  I recommended close follow-up with her primary care physician to discuss possible age-appropriate cancer screening and brain imaging given her headaches further. -     RPR -  Hepatitis B surface antigen -     CBC with Differential -     Basic Metabolic Panel -     Hepatic Function Panel Encounter for screening for HIV -     HIV antibody (with reflex)  Terisa Starr, MD  Family Medicine Teaching Service

## 2019-01-22 ENCOUNTER — Encounter: Payer: Self-pay | Admitting: Family Medicine

## 2019-01-22 LAB — CBC WITH DIFFERENTIAL/PLATELET
Basophils Absolute: 0 10*3/uL (ref 0.0–0.2)
Basos: 0 %
EOS (ABSOLUTE): 0.1 10*3/uL (ref 0.0–0.4)
Eos: 1 %
Hematocrit: 41.8 % (ref 34.0–46.6)
Hemoglobin: 13.8 g/dL (ref 11.1–15.9)
IMMATURE GRANULOCYTES: 0 %
Immature Grans (Abs): 0 10*3/uL (ref 0.0–0.1)
Lymphocytes Absolute: 1.4 10*3/uL (ref 0.7–3.1)
Lymphs: 28 %
MCH: 30.7 pg (ref 26.6–33.0)
MCHC: 33 g/dL (ref 31.5–35.7)
MCV: 93 fL (ref 79–97)
Monocytes Absolute: 0.3 10*3/uL (ref 0.1–0.9)
Monocytes: 7 %
NEUTROS PCT: 64 %
Neutrophils Absolute: 3.1 10*3/uL (ref 1.4–7.0)
Platelets: 176 10*3/uL (ref 150–450)
RBC: 4.5 x10E6/uL (ref 3.77–5.28)
RDW: 13.1 % (ref 11.7–15.4)
WBC: 4.9 10*3/uL (ref 3.4–10.8)

## 2019-01-22 LAB — BASIC METABOLIC PANEL
BUN/Creatinine Ratio: 17 (ref 12–28)
BUN: 12 mg/dL (ref 8–27)
CO2: 21 mmol/L (ref 20–29)
Calcium: 9.5 mg/dL (ref 8.7–10.3)
Chloride: 104 mmol/L (ref 96–106)
Creatinine, Ser: 0.7 mg/dL (ref 0.57–1.00)
GFR calc Af Amer: 101 mL/min/{1.73_m2} (ref >59)
GFR, EST NON AFRICAN AMERICAN: 87 mL/min/{1.73_m2} (ref >59)
Glucose: 90 mg/dL (ref 65–99)
POTASSIUM: 4.2 mmol/L (ref 3.5–5.2)
Sodium: 144 mmol/L (ref 134–144)

## 2019-01-22 LAB — RPR: RPR Ser Ql: NONREACTIVE

## 2019-01-22 LAB — HEPATIC FUNCTION PANEL
ALT: 25 IU/L (ref 0–32)
AST: 22 IU/L (ref 0–40)
Albumin: 5 g/dL — ABNORMAL HIGH (ref 3.7–4.7)
Alkaline Phosphatase: 56 IU/L (ref 39–117)
Bilirubin Total: 0.5 mg/dL (ref 0.0–1.2)
Bilirubin, Direct: 0.14 mg/dL (ref 0.00–0.40)
Total Protein: 7.4 g/dL (ref 6.0–8.5)

## 2019-01-22 LAB — HEPATITIS B SURFACE ANTIGEN: Hepatitis B Surface Ag: NEGATIVE

## 2019-01-22 LAB — HIV ANTIBODY (ROUTINE TESTING W REFLEX): HIV Screen 4th Generation wRfx: NONREACTIVE

## 2019-03-02 ENCOUNTER — Ambulatory Visit: Payer: Medicare Other | Admitting: Family Medicine

## 2019-04-17 ENCOUNTER — Other Ambulatory Visit: Payer: Self-pay | Admitting: Family Medicine

## 2019-06-11 ENCOUNTER — Other Ambulatory Visit: Payer: Self-pay | Admitting: Family Medicine

## 2019-07-27 ENCOUNTER — Encounter: Payer: Self-pay | Admitting: Family Medicine

## 2019-08-19 ENCOUNTER — Encounter: Payer: Self-pay | Admitting: Family Medicine

## 2019-08-19 ENCOUNTER — Other Ambulatory Visit: Payer: Self-pay

## 2019-08-19 ENCOUNTER — Ambulatory Visit (INDEPENDENT_AMBULATORY_CARE_PROVIDER_SITE_OTHER): Payer: Medicare Other | Admitting: Family Medicine

## 2019-08-19 DIAGNOSIS — S39012A Strain of muscle, fascia and tendon of lower back, initial encounter: Secondary | ICD-10-CM | POA: Diagnosis not present

## 2019-08-19 DIAGNOSIS — M8580 Other specified disorders of bone density and structure, unspecified site: Secondary | ICD-10-CM | POA: Diagnosis not present

## 2019-08-19 DIAGNOSIS — Z23 Encounter for immunization: Secondary | ICD-10-CM

## 2019-08-19 DIAGNOSIS — E559 Vitamin D deficiency, unspecified: Secondary | ICD-10-CM | POA: Diagnosis not present

## 2019-08-19 MED ORDER — CHOLECALCIFEROL 10 MCG (400 UNIT) PO CAPS: 800.0000 [IU] | ORAL_CAPSULE | Freq: Every day | ORAL | 0 refills | Status: DC

## 2019-08-19 MED ORDER — DICLOFENAC SODIUM 1 % TD GEL: 2.0000 g | Freq: Four times a day (QID) | TRANSDERMAL | 3 refills | Status: DC

## 2019-08-19 MED ORDER — HYPROMELLOSE (GONIOSCOPIC) 2.5 % OP SOLN: 1.0000 [drp] | OPHTHALMIC | 12 refills | Status: DC | PRN

## 2019-08-19 NOTE — Progress Notes (Signed)
   [facility] Phone: [phone]     Abigail Riley - 73 y.o. female MRN [id number]  Date of birth: 1946-01-06  Subjective:   cc: back pain  HPI:  Back pain:   Back pain for two weeks.  Right sided and middle.  Walks 30 min in mornning and afternoon.  No other trauma or excessive exercise.  4 years ago she had back pain and used icyhot. Takes tylenol for pain. No leg pain but has tiredness in her legs. No balance issues.  Legs and hands have numbness when she sits for a long time.  She has some urge incontinence.  No saddle anesthesia.    InterpreterSimona Huh: [id number]  ROS: See HPI for pertinent positives and negatives  Family history reviewed for today's visit. No changes.  Social history- patient is a non smoker   Objective:   BP 121/80   Pulse 72   Temp 97.6 F (36.4 C) (Oral)   Wt 125 lb (56.7 kg)   SpO2 92%   BMI 27.05 kg/m  Gen: NAD, alert and oriented, cooperative with exam CV: normal rate, regular rhythm. No murmurs, no rubs.  Resp: LCTAB, no wheezes, crackles. normal work of breathing Msk: mild TTP in lumbar spine.  No gait abnormality Neuro: sensation intact in lower extremities.  Psych: Appropriate behavior  Assessment/Plan:   Lumbar strain, initial encounter No known episode of trauma or overuse.  No sciatica.  Ongoing for two weeks.  - tylenol q6h - diclofenac gel prn - ice during rest - f/u in 4 weeks if not improved.   Clemetine Marker, MD PGY-2 [facility]

## 2019-08-19 NOTE — Patient Instructions (Signed)
Please take your tylenol for the pain and use this gel up to 4 times a day to help with the back pain.  Also try using ice on the area when you are resting.  If it does not improve in 4 weeks you can schedule an appointment with your pcp.    Have a great day,   Clemetine Marker, MD    Diclofenac skin gel What is this medicine? DICLOFENAC (dye KLOE fen ak) is a non-steroidal anti-inflammatory drug (NSAID). The 1% skin gel is used to treat osteoarthritis of the hands or knees. The 3% skin gel is used to treat actinic keratosis. This medicine may be used for other purposes; ask your health care provider or pharmacist if you have questions. COMMON BRAND NAME(S): DSG Sherrye Payor, Solaravix, Solaraze, Voltaren Gel What should I tell my health care provider before I take this medicine? They need to know if you have any of these conditions:  asthma  bleeding problems  coronary artery bypass graft (CABG) surgery within the past 2 weeks  heart disease  high blood pressure  if you frequently drink alcohol containing drinks  kidney disease  liver disease  open or infected skin  stomach problems  an unusual or allergic reaction to diclofenac, aspirin, other NSAIDs, other medicines, benzyl alcohol (3% gel only), foods, dyes, or preservatives  pregnant or trying to get pregnant  breast-feeding How should I use this medicine? This medicine is for external use only. Follow the directions on the prescription label. Wash hands before and after use. Do not get this medicine in your eyes. If you do, rinse out with plenty of cool tap water. Use your doses at regular intervals. Do not use your medicine more often than directed. A special MedGuide will be given to you by the pharmacist with each prescription and refill of the 1% gel. Be sure to read this information carefully each time. Talk to your pediatrician regarding the use of this medicine in children. Special care may be needed. The 3% gel is not  approved for use in children. Overdosage: If you think you have taken too much of this medicine contact a poison control center or emergency room at once. NOTE: This medicine is only for you. Do not share this medicine with others. What if I miss a dose? If you miss a dose, use it as soon as you can. If it is almost time for your next dose, use only that dose. Do not use double or extra doses. What may interact with this medicine?  aspirin  NSAIDs, medicines for pain and inflammation, like ibuprofen or naproxen Do not use any other skin products without telling your doctor or health care professional. This list may not describe all possible interactions. Give your health care provider a list of all the medicines, herbs, non-prescription drugs, or dietary supplements you use. Also tell them if you smoke, drink alcohol, or use illegal drugs. Some items may interact with your medicine. What should I watch for while using this medicine? Tell your doctor or healthcare provider if your symptoms do not start to get better or if they get worse. You will need to follow up with your healthcare provider to monitor your progress. You may need to be treated for up to 3 months if you are using the 3% gel, but the full effect may not occur until 1 month after stopping treatment. If you develop a severe skin reaction, contact your doctor or healthcare provider immediately. This medicine  may cause serious skin reactions. They can happen weeks to months after starting the medicine. Contact your healthcare provider right away if you notice fevers or flu-like symptoms with a rash. The rash may be red or purple and then turn into blisters or peeling of the skin. Or, you might notice a red rash with swelling of the face, lips or lymph nodes in your neck or under your arms. This medicine can make you more sensitive to the sun. Keep out of the sun. If you cannot avoid being in the sun, wear protective clothing and use  sunscreen. Do not use sun lamps or tanning beds/booths. Do not take medicines such as ibuprofen and naproxen with this medicine. Side effects such as stomach upset, nausea, or ulcers may be more likely to occur. Many medicines available without a prescription should not be taken with this medicine. This medicine does not prevent heart attack or stroke. In fact, this medicine may increase the chance of a heart attack or stroke. The chance may increase with longer use of this medicine and in people who have heart disease. If you take aspirin to prevent heart attack or stroke, talk with your doctor or healthcare provider. This medicine can cause ulcers and bleeding in the stomach and intestines at any time during treatment. Do not smoke cigarettes or drink alcohol. These increase irritation to your stomach and can make it more susceptible to damage from this medicine. Ulcers and bleeding can happen without warning symptoms and can cause death. You may get drowsy or dizzy. Do not drive, use machinery, or do anything that needs mental alertness until you know how this medicine affects you. Do not stand or sit up quickly, especially if you are an older patient. This reduces the risk of dizzy or fainting spells. This medicine can cause you to bleed more easily. Try to avoid damage to your teeth and gums when you brush or floss your teeth. What side effects may I notice from receiving this medicine? Side effects that you should report to your doctor or health care professional as soon as possible:  allergic reactions like skin rash, itching or hives, swelling of the face, lips, or tongue  black or bloody stools, blood in the urine or vomit  blurred vision  chest pain  difficulty breathing or wheezing  nausea or vomiting  rash, fever, and swollen lymph nodes  redness, blistering, peeling or loosening of the skin, including inside the mouth  slurred speech or weakness on one side of the  body  trouble passing urine or change in the amount of urine  unexplained weight gain or swelling  unusually weak or tired  yellowing of eyes or skin Side effects that usually do not require medical attention (report to your doctor or health care professional if they continue or are bothersome):  dizziness  dry skin  headache  heartburn  increased sensitivity to the sun  stomach pain  tingling at the application site This list may not describe all possible side effects. Call your doctor for medical advice about side effects. You may report side effects to FDA at 1-800-FDA-1088. Where should I keep my medicine? Keep out of the reach of children. Store the 1% gel at room temperature between 15 and 30 degrees C (59 and 86 degrees F). Store the 3% gel at room temperature between 20 and 25 degrees C (68 and 77 degrees F). Protect from light. Throw away any unused medicine after the expiration date. NOTE: This sheet  is a summary. It may not cover all possible information. If you have questions about this medicine, talk to your doctor, pharmacist, or health care provider.  2020 Elsevier/Gold Standard (2019-01-20 13:05:18)

## 2019-08-20 DIAGNOSIS — S39012A Strain of muscle, fascia and tendon of lower back, initial encounter: Secondary | ICD-10-CM | POA: Insufficient documentation

## 2019-08-20 NOTE — Assessment & Plan Note (Signed)
No known episode of trauma or overuse.  No sciatica.  Ongoing for two weeks.  - tylenol q6h - diclofenac gel prn - ice during rest - f/u in 4 weeks if not improved.

## 2019-09-03 ENCOUNTER — Other Ambulatory Visit: Payer: Self-pay | Admitting: Family Medicine

## 2019-09-09 ENCOUNTER — Other Ambulatory Visit: Payer: Self-pay | Admitting: Family Medicine

## 2019-09-10 ENCOUNTER — Other Ambulatory Visit: Payer: Self-pay | Admitting: Family Medicine

## 2019-12-22 ENCOUNTER — Other Ambulatory Visit: Payer: Self-pay | Admitting: *Deleted

## 2019-12-22 MED ORDER — LISINOPRIL 40 MG PO TABS: 40.0000 mg | ORAL_TABLET | Freq: Every day | ORAL | 0 refills | Status: DC

## 2020-01-20 ENCOUNTER — Ambulatory Visit: Payer: Medicare Other | Attending: Internal Medicine

## 2020-01-20 DIAGNOSIS — Z23 Encounter for immunization: Secondary | ICD-10-CM | POA: Insufficient documentation

## 2020-01-20 NOTE — Progress Notes (Signed)
   Covid-19 Vaccination Clinic  Name:  Kanani Mowbray    MRN: [id number] DOB: 01-07-1946  01/20/2020  Ms. Serratore was observed post Covid-19 immunization for 15 minutes without incident. She was provided with Vaccine Information Sheet and instruction to access the V-Safe system.   Ms. Nogales was instructed to call 911 with any severe reactions post vaccine: Marland Kitchen Difficulty breathing  . Swelling of face and throat  . A fast heartbeat  . A bad rash all over body  . Dizziness and weakness

## 2020-02-15 ENCOUNTER — Ambulatory Visit: Payer: Medicare Other | Attending: Internal Medicine

## 2020-02-15 DIAGNOSIS — Z23 Encounter for immunization: Secondary | ICD-10-CM

## 2020-02-15 NOTE — Progress Notes (Signed)
   Covid-19 Vaccination Clinic  Name:  Abigail Riley    MRN: [id number] DOB: [birth year]  02/15/2020  Ms. Taillon was observed post Covid-19 immunization for 15 minutes without incident. She was provided with Vaccine Information Sheet and instruction to access the V-Safe system.   Ms. Scheidegger was instructed to call 911 with any severe reactions post vaccine: Marland Kitchen Difficulty breathing  . Swelling of face and throat  . A fast heartbeat  . A bad rash all over body  . Dizziness and weakness   Immunizations Administered    Name Date Dose VIS Date Route   Pfizer COVID-19 Vaccine 02/15/2020  4:28 PM 0.3 mL 10/29/2019 Intramuscular   Manufacturer: [employer], [employer]   Lot: [id number]   NDC: [address]-5

## 2020-03-24 ENCOUNTER — Other Ambulatory Visit: Payer: Self-pay | Admitting: Family Medicine

## 2020-05-01 ENCOUNTER — Encounter: Payer: Self-pay | Admitting: Family Medicine

## 2020-05-01 ENCOUNTER — Ambulatory Visit
Admission: RE | Admit: 2020-05-01 | Discharge: 2020-05-01 | Disposition: A | Discharge status: Home / Self Care | Payer: Medicare Other | Source: Ambulatory Visit | Attending: Family Medicine | Admitting: Family Medicine

## 2020-05-01 ENCOUNTER — Other Ambulatory Visit: Payer: Self-pay

## 2020-05-01 ENCOUNTER — Ambulatory Visit (INDEPENDENT_AMBULATORY_CARE_PROVIDER_SITE_OTHER): Payer: Medicare Other | Admitting: Family Medicine

## 2020-05-01 VITALS — BP 110/80 | HR 70 | Temp 98.7°F | Wt 131.0 lb

## 2020-05-01 DIAGNOSIS — S39012A Strain of muscle, fascia and tendon of lower back, initial encounter: Secondary | ICD-10-CM

## 2020-05-01 DIAGNOSIS — M810 Age-related osteoporosis without current pathological fracture: Secondary | ICD-10-CM

## 2020-05-01 DIAGNOSIS — Z1231 Encounter for screening mammogram for malignant neoplasm of breast: Secondary | ICD-10-CM

## 2020-05-01 DIAGNOSIS — I10 Essential (primary) hypertension: Secondary | ICD-10-CM

## 2020-05-01 DIAGNOSIS — Z0000 Encounter for general adult medical examination without abnormal findings: Secondary | ICD-10-CM

## 2020-05-01 NOTE — Progress Notes (Signed)
    SUBJECTIVE:   Chief compliant/HPI: annual examination  Abigail Riley is a 74 y.o. who presents today for an annual exam.   Low back pain: on and off the past 2-4 months, worse on the right side. Patient exercises regularly. Tylenol and stretches for the pain currently help. She denies any bowel or bladder incontinence, she denies any lower extremity weakness.  She reports she also uses Voltaren gel from time to time which helps with her pain.  She denies any known trauma.  Patient does have history of osteoporosis.  Patient does report some foot and hand numbness after sitting for a long time which has been going on for many years.  History tabs reviewed and updated.   Review of systems form reviewed and notable for above.   OBJECTIVE:   BP 110/80   Pulse 70   Temp 98.7 F (37.1 C) (Oral)   Wt 131 lb (59.4 kg)   SpO2 96%   BMI 28.35 kg/m   General: NAD, pleasant Eyes: PERRL, EOMI, no conjunctival pallor or injection ENTM: Moist mucous membranes, no pharyngeal erythema or exudate, TM's with sclerosis noted bilaterally no signs of infection or obstruction of cancal Neck: Supple Cardiovascular: RRR, no m/r/g, no LE edema Respiratory: CTA BL, normal work of breathing MSK: moves 4 extremities equally, BLLE with 5/5 strength, sensation grossly intact. Normal gait Derm: no rashes appreciated Psych: AOx3, appropriate affect  ASSESSMENT/PLAN:   Lumbar strain, initial encounter Given continued low back pain obtained DG lumbar spine as patient also has history of osteoporosis.  See results below.  No red flags on exam.  Patient encouraged to continue with heating pad, Tylenol, Voltaren gel and stretches.  Patient is amenable to physical therapy and given findings on x-ray physical therapy may be able to help strengthen to improve patient's back pain.  Osteoarthritic change to varying degrees at all levels. 5 mm of anterolisthesis of L4 on L5 is felt to be due to underlying spondylosis. No  other spondylolisthesis. No fracture. Mild lower lumbar levoscoliosis.    Annual Examination  See AVS for age appropriate recommendations  PHQ reviewed and discussed.  BP reviewed and at goal.   Considered the following items based upon USPSTF recommendations: Diabetes screening: ordered Screening for elevated cholesterol: ordered Osteoporosis screening to be repeated with DEXA. Patient compliant with calcium, vitamin D and alendronate. DEXA ordered.  Reviewed risk factors for latent tuberculosis and not indicated  Cervical cancer screening: aged out Breast cancer screening: up to date Colorectal cancer screening: up to date on screening for CRC.  Vaccinations up to date.   Follow up in 1 year or sooner if indicated.    [place] Gerrett Loman, DO [facility]

## 2020-05-01 NOTE — Patient Instructions (Addendum)
Thank you for coming to see me today. It was a pleasure! Today we talked about:   I have placed an order for a mammogram and a bone density scan.  They will call you in order to schedule this.  We have collected lab work today to check for your kidney function and cholesterol.  I will send a letter if all of the results are normal and if anything is abnormal then I will call you.  Please continue taking your current medications including your alendronate for bone density, atorvastatin for cholesterol, lisinopril for high blood pressure and vitamin D and calcium for your bones.  For your back pain, I have ordered an x-ray to be done.  You may walk-in at any time to have this completed.  If there is anything abnormal then I will call you.  Please continue doing stretches and I have placed some below.  You may continue to take Tylenol as needed for pain and use the cream as well as a heating pad.  For your wrists, I recommend scheduling appointment if your symptoms worsen and you may use Voltaren gel on the area if needed in the meantime.  Please follow-up with our clinic if your symptoms worsen,  in 6 months for regular checkup or sooner as needed.  If you have any questions or concerns, please do not hesitate to call the office at [phone].  Take Care,   [place] Darnell Stimson, DO  Low Back Sprain or Strain Rehab Ask your health care provider which exercises are safe for you. Do exercises exactly as told by your health care provider and adjust them as directed. It is normal to feel mild stretching, pulling, tightness, or discomfort as you do these exercises. Stop right away if you feel sudden pain or your pain gets worse. Do not begin these exercises until told by your health care provider. Stretching and range-of-motion exercises These exercises warm up your muscles and joints and improve the movement and flexibility of your back. These exercises also help to relieve pain, numbness, and  tingling. Lumbar rotation  1. Lie on your back on a firm surface and bend your knees. 2. Straighten your arms out to your sides so each arm forms a 90-degree angle (right angle) with a side of your body. 3. Slowly move (rotate) both of your knees to one side of your body until you feel a stretch in your lower back (lumbar). Try not to let your shoulders lift off the floor. 4. Hold this position for __________ seconds. 5. Tense your abdominal muscles and slowly move your knees back to the starting position. 6. Repeat this exercise on the other side of your body. . Single knee to chest  1. Lie on your back on a firm surface with both legs straight. 2. Bend one of your knees. Use your hands to move your knee up toward your chest until you feel a gentle stretch in your lower back and buttock. ? Hold your leg in this position by holding on to the front of your knee. ? Keep your other leg as straight as possible. 3. Hold this position for __________ seconds. 4. Slowly return to the starting position. 5. Repeat with your other leg.  Prone extension on elbows  1. Lie on your abdomen on a firm surface (prone position). 2. Prop yourself up on your elbows. 3. Use your arms to help lift your chest up until you feel a gentle stretch in your abdomen and your lower  back. ? This will place some of your body weight on your elbows. If this is uncomfortable, try stacking pillows under your chest. ? Your hips should stay down, against the surface that you are lying on. Keep your hip and back muscles relaxed. 4. Hold this position for __________ seconds. 5. Slowly relax your upper body and return to the starting position.  Strengthening exercises These exercises build strength and endurance in your back. Endurance is the ability to use your muscles for a long time, even after they get tired. Pelvic tilt This exercise strengthens the muscles that lie deep in the abdomen. 1. Lie on your back on a firm  surface. Bend your knees and keep your feet flat on the floor. 2. Tense your abdominal muscles. Tip your pelvis up toward the ceiling and flatten your lower back into the floor. ? To help with this exercise, you may place a small towel under your lower back and try to push your back into the towel. 3. Hold this position for __________ seconds. 4. Let your muscles relax completely before you repeat this exercise.  Alternating arm and leg raises  1. Get on your hands and knees on a firm surface. If you are on a hard floor, you may want to use padding, such as an exercise mat, to cushion your knees. 2. Line up your arms and legs. Your hands should be directly below your shoulders, and your knees should be directly below your hips. 3. Lift your left leg behind you. At the same time, raise your right arm and straighten it in front of you. ? Do not lift your leg higher than your hip. ? Do not lift your arm higher than your shoulder. ? Keep your abdominal and back muscles tight. ? Keep your hips facing the ground. ? Do not arch your back. ? Keep your balance carefully, and do not hold your breath. 4. Hold this position for __________ seconds. 5. Slowly return to the starting position. 6. Repeat with your right leg and your left arm.  Abdominal set with straight leg raise  1. Lie on your back on a firm surface. 2. Bend one of your knees and keep your other leg straight. 3. Tense your abdominal muscles and lift your straight leg up, 4-6 inches (10-15 cm) off the ground. 4. Keep your abdominal muscles tight and hold this position for __________ seconds. ? Do not hold your breath. ? Do not arch your back. Keep it flat against the ground. 5. Keep your abdominal muscles tense as you slowly lower your leg back to the starting position. 6. Repeat with your other leg. Repeat __________ times. Complete this exercise __________ times a day. Single leg lower with bent knees 1. Lie on your back on a  firm surface. 2. Tense your abdominal muscles and lift your feet off the floor, one foot at a time, so your knees and hips are bent in 90-degree angles (right angles). ? Your knees should be over your hips and your lower legs should be parallel to the floor. 3. Keeping your abdominal muscles tense and your knee bent, slowly lower one of your legs so your toe touches the ground. 4. Lift your leg back up to return to the starting position. ? Do not hold your breath. ? Do not let your back arch. Keep your back flat against the ground. 5. Repeat with your other leg.  Posture and body mechanics Good posture and healthy body mechanics can help to relieve  stress in your body's tissues and joints. Body mechanics refers to the movements and positions of your body while you do your daily activities. Posture is part of body mechanics. Good posture means:  Your spine is in its natural S-curve position (neutral).  Your shoulders are pulled back slightly.  Your head is not tipped forward. Follow these guidelines to improve your posture and body mechanics in your everyday activities. Standing   When standing, keep your spine neutral and your feet about hip width apart. Keep a slight bend in your knees. Your ears, shoulders, and hips should line up.  When you do a task in which you stand in one place for a long time, place one foot up on a stable object that is 2-4 inches (5-10 cm) high, such as a footstool. This helps keep your spine neutral. Sitting   When sitting, keep your spine neutral and keep your feet flat on the floor. Use a footrest, if necessary, and keep your thighs parallel to the floor. Avoid rounding your shoulders, and avoid tilting your head forward.  When working at a desk or a computer, keep your desk at a height where your hands are slightly lower than your elbows. Slide your chair under your desk so you are close enough to maintain good posture.  When working at a computer, place  your monitor at a height where you are looking straight ahead and you do not have to tilt your head forward or downward to look at the screen. Resting  When lying down and resting, avoid positions that are most painful for you.  If you have pain with activities such as sitting, bending, stooping, or squatting, lie in a position in which your body does not bend very much. For example, avoid curling up on your side with your arms and knees near your chest (fetal position).  If you have pain with activities such as standing for a long time or reaching with your arms, lie with your spine in a neutral position and bend your knees slightly. Try the following positions: ? Lying on your side with a pillow between your knees. ? Lying on your back with a pillow under your knees. Lifting   When lifting objects, keep your feet at least shoulder width apart and tighten your abdominal muscles.  Bend your knees and hips and keep your spine neutral. It is important to lift using the strength of your legs, not your back. Do not lock your knees straight out.  Always ask for help to lift heavy or awkward objects. This information is not intended to replace advice given to you by your health care provider. Make sure you discuss any questions you have with your health care provider. Document Revised: 02/26/2019 Document Reviewed: 11/26/2018 Elsevier Patient Education  2020 [employer].

## 2020-05-02 LAB — LIPID PANEL
Chol/HDL Ratio: 3.4 ratio (ref 0.0–4.4)
Cholesterol, Total: 171 mg/dL (ref 100–199)
HDL: 51 mg/dL (ref >39)
LDL Chol Calc (NIH): 96 mg/dL (ref 0–99)
Triglycerides: 139 mg/dL (ref 0–149)
VLDL Cholesterol Cal: 24 mg/dL (ref 5–40)

## 2020-05-02 LAB — BASIC METABOLIC PANEL
BUN/Creatinine Ratio: 23 (ref 12–28)
BUN: 17 mg/dL (ref 8–27)
CO2: 21 mmol/L (ref 20–29)
Calcium: 9.9 mg/dL (ref 8.7–10.3)
Chloride: 102 mmol/L (ref 96–106)
Creatinine, Ser: 0.75 mg/dL (ref 0.57–1.00)
GFR calc Af Amer: 91 mL/min/{1.73_m2} (ref >59)
GFR calc non Af Amer: 79 mL/min/{1.73_m2} (ref >59)
Glucose: 85 mg/dL (ref 65–99)
Potassium: 4.3 mmol/L (ref 3.5–5.2)
Sodium: 142 mmol/L (ref 134–144)

## 2020-05-03 ENCOUNTER — Encounter: Payer: Self-pay | Admitting: Family Medicine

## 2020-05-03 NOTE — Assessment & Plan Note (Signed)
Given continued low back pain obtained DG lumbar spine as patient also has history of osteoporosis.  See results below.  No red flags on exam.  Patient encouraged to continue with heating pad, Tylenol, Voltaren gel and stretches.  Patient is amenable to physical therapy and given findings on x-ray physical therapy may be able to help strengthen to improve patient's back pain.  Osteoarthritic change to varying degrees at all levels. 5 mm of anterolisthesis of L4 on L5 is felt to be due to underlying spondylosis. No other spondylolisthesis. No fracture. Mild lower lumbar levoscoliosis.

## 2020-07-03 ENCOUNTER — Encounter: Payer: Self-pay | Admitting: Family Medicine

## 2020-08-23 ENCOUNTER — Other Ambulatory Visit: Payer: Self-pay | Admitting: Family Medicine

## 2020-09-14 ENCOUNTER — Other Ambulatory Visit: Payer: Self-pay

## 2020-09-14 MED ORDER — ALENDRONATE SODIUM 70 MG PO TABS
ORAL_TABLET | ORAL | 1 refills | Status: DC
Start: 2020-09-14 — End: 2020-12-29

## 2020-11-08 ENCOUNTER — Encounter: Payer: Self-pay | Admitting: Family Medicine

## 2020-11-08 ENCOUNTER — Ambulatory Visit (INDEPENDENT_AMBULATORY_CARE_PROVIDER_SITE_OTHER): Payer: Medicare Other | Admitting: Family Medicine

## 2020-11-08 ENCOUNTER — Other Ambulatory Visit: Payer: Self-pay

## 2020-11-08 VITALS — BP 122/78 | HR 82 | Ht <= 58 in | Wt 119.0 lb

## 2020-11-08 DIAGNOSIS — F331 Major depressive disorder, recurrent, moderate: Secondary | ICD-10-CM

## 2020-11-08 DIAGNOSIS — F5105 Insomnia due to other mental disorder: Secondary | ICD-10-CM | POA: Diagnosis not present

## 2020-11-08 DIAGNOSIS — F99 Mental disorder, not otherwise specified: Secondary | ICD-10-CM | POA: Diagnosis not present

## 2020-11-08 MED ORDER — TRAZODONE HCL 50 MG PO TABS: 25.0000 mg | ORAL_TABLET | Freq: Every evening | ORAL | 2 refills | Status: DC | PRN

## 2020-11-08 MED ORDER — CITALOPRAM HYDROBROMIDE 20 MG PO TABS: 20.0000 mg | ORAL_TABLET | Freq: Every day | ORAL | 3 refills | Status: DC

## 2020-11-08 NOTE — Assessment & Plan Note (Signed)
Elevated PHQ-9 score of 17 with positive answer to #9. Endorses passive thoughts of "it would be easier not to be here" but denies active SI/HI or plan. Discussed optioned including medication and/or counseling/CBT. Patient has opted for medication but open to therapy resources. Will follow up q 7-10 days until improving then on monthly basis using PHQ9.  Regarding medication, will assess safety (SI), tolerability (SE), and efficacy (repeat PHQ9) at next visit and titrate up as needed. Safety plan discussed with the patient. They are aware of how to contact crisis services if need be and instructed to go to the nearest emergency room if they feel they are in imminent danger of harming themselves and or others, or symptoms are of out control or unbearable.  -MDQ not indicated. -TSH given weight loss and depression -Referral to [facility] for counseling - History of rash with Mirtazepine - f/u scheduled for 11/23/19 at 8:50am

## 2020-11-08 NOTE — Assessment & Plan Note (Signed)
Plan as above. Trazodone 25-50mg  qHS PRN for insomnia

## 2020-11-08 NOTE — Patient Instructions (Signed)
R?t vui ???c g?p b?n hm nay!  C?m ?n b?n ? l?a ch?n [facility] ?? ch?m [place] s?c kh?e ban ??u cho mnh.  K? ho?ch c?a chng ti cho ngy hm nay l: Vui lng u?ng Citalopram 20mg  m?i ngy Vui lng u?ng Trazodone 0,5 ??n 1 vin vo ban ?m ?? gip ng? ngon Vui lng ch? m?t cu?c g?i t? trung tm s?c kh?e hnh vi c?a chng ti ?? ???c tr? li?u H?n g?p l?i b?n vo lc 8:50 sng 11/23/19 ?? ti?n theo di  L?i chc t?t nh?t,  01/21/20, DO   N?u b?n ?ang c?m th?y mu?n t? t? ho?c cc tri?u ch?ng tr?m c?m tr? nn t?i t? h?n, vui lng truy c?p ngay:  Kh? d?ng 24 gi? S?c kh?e hnh vi c?a h?t [place] [address] [place], [place] Ti?n tuy?n [phone] Kh?ng ho?ng [phone]    N?u b?n ?ang ngh? ??n vi?c lm h?i b?n thn ho?c c  ??nh t? t?, ho?c n?u b?n bi?t ai ?, hy tm ki?m s? gip ?? ngay l?p t?c. G?i cho bc s? ho?c nh cung c?p d?ch v? ch?m [place] s?c kh?e tm th?n c?a b?n. G?i 911 ho?c ??n phng c?p c?u c?a b?nh vi?n ?? ???c gip ?? ngay l?p t?c, ho?c nh? b?n b ho?c thnh vin gia ?nh gip b?n lm nh?ng vi?c ny. G?i cho ???ng dy nng 24 gi? mi?n ph c?a ???ng dy nng ng?n ch?n t? t? c?a [place]? theo s? [phone]) ho?c [phone] TTY [phone] ?? ni chuy?n v?i m?t c? v?n ???c ?o t?o. N?u b?n ?ang g?p kh?ng ho?ng, hy ch?c ch?n r?ng b?n khng b? b? l?i m?t mnh. N?u ai ? ?ang g?p kh?ng ho?ng, hy ??m b?o r?ng h? khng b? b? l?i m?t mnh   D?ch v? Gia ?nh c?a ???ng dy Kh?ng ho?ng [place] (B?o l?c gia ?nh, hi?p dm & h? tr? n?n nhn [phone]  D?ch v? Kh?ng ho?ng ?i?m cao c?a [facility] (CH? t? 8 gi? sng ??n 4 gi? chi?u) [phone]  ??n v? Tr? li?u Kh?ng ho?ng Di ??ng Thay th? (24/7) [phone]  ???ng dy nng v? t? t? qu?c gia [phone] K? [phone] (NI)   It was a pleasure to see you today!  Thank you for choosing [facility] for your primary care.   Our plans for today were:  Please take  Citalopram 20mg  daily  Please take Trazodone 0.5 to 1 tablet at night to help with sleep  Please expect a call from our behavioral health center for therapy  I will see you on 11/23/19 at 8:50 am for follow up  Best Wishes,   , DO   If you are feeling suicidal or depression symptoms worsen please immediately go to:   24 Hour Availability [facility]  [address] [place], [address] [place] [facility] [phone]    . If you are thinking about harming yourself or having thoughts of suicide, or if you know someone who is, seek help right away. . Call your doctor or mental health care provider. . Call 911 or go to a hospital emergency room to get immediate help, or ask a friend or family member to help you do these things. . Call the [phone] National Suicide Prevention Lifeline's toll-free, 24-hour hotline at 1-800-273-TALK (225)520-6243) or TTY: 1-800-799-4 TTY (424)478-8689) to talk to a trained counselor. . If you are in crisis, make sure you are not left alone.  . If  someone else is in crisis, make sure he or she is not left alone   [facility] [employer] (Domestic Violence, Rape & Victim Assistance [phone]  [facility]    (ONLY from 8am-4pm)    [phone]  [facility] Mobile Crisis Unit (24/7)   [phone]  [place] National Suicide Hotline   ([phone] Len Childs)

## 2020-11-08 NOTE — Progress Notes (Signed)
Subjective:   Patient ID: Abigail Riley    DOB: 1946/05/06, 74 y.o. female   MRN: [id number]  Abigail Riley is a 74 y.o. female with a history of HTN, lumbar strain, osteoporosis, depression, hypercholestrolemia, insomnia here for sleeping problems.  Sleeping Problems: Patient presents with sleep concerns. She has been treated in the past with Trazodone 25-50mg  PRN. She notes that she has been feeling more depressed lately and this is affecting her sleep. She notes family problems at home, particularly the children, which is making her anxious and unable to sleep. She has history of depression in which she has taken Citalopram 20mg . She notes that this has helped her in the past.  PHQ-9 score: 17 with positive answer to questions #9.   She denies any active SI/HI. Denies any plan or intent. Just thoughts of "it would be easier not to be around".  She is not interested in therapy.   Review of Systems:  Per HPI.   Objective:   BP 122/78   Pulse 82   Ht 4' 9.28" (1.455 m)   Wt 119 lb (54 kg)   SpO2 95%   BMI 25.50 kg/m  Vitals and nursing note reviewed.  General: pleasant elderly woman, sitting comfortably in exam chair, well nourished, well developed, in no acute distress with non-toxic appearance, hard of hearing Resp: breathing comfortably on room air, speaking in full sentences MSK: gait normal Neuro: Alert and oriented, speech normal  Assessment & Plan:   Moderate episode of recurrent major depressive disorder (HCC) Elevated PHQ-9 score of 17 with positive answer to #9. Endorses passive thoughts of "it would be easier not to be here" but denies active SI/HI or plan. Discussed optioned including medication and/or counseling/CBT. Patient has opted for medication but open to therapy resources. Will follow up q 7-10 days until improving then on monthly basis using PHQ9.  Regarding medication, will assess safety (SI), tolerability (SE), and efficacy (repeat PHQ9) at next visit and titrate up  as needed. Safety plan discussed with the patient. They are aware of how to contact crisis services if need be and instructed to go to the nearest emergency room if they feel they are in imminent danger of harming themselves and or others, or symptoms are of out control or unbearable.  -MDQ not indicated. -TSH given weight loss and depression -Referral to [facility] for counseling - History of rash with Mirtazepine - f/u scheduled for 11/23/19 at 8:50am  Insomnia Plan as above. Trazodone 25-50mg  qHS PRN for insomnia  Orders Placed This Encounter  Procedures  . TSH  . Ambulatory referral to Psychiatry    Referral Priority:   Routine    Referral Type:   Psychiatric    Referral Reason:   Specialty Services Required    Requested Specialty:   Psychiatry    Number of Visits Requested:   1   Meds ordered this encounter  Medications  . citalopram (CELEXA) 20 MG tablet    Sig: Take 1 tablet (20 mg total) by mouth daily.    Dispense:  30 tablet    Refill:  3  . traZODone (DESYREL) 50 MG tablet    Sig: Take 0.5-1 tablets (25-50 mg total) by mouth at bedtime as needed for sleep.    Dispense:  90 tablet    Refill:  2   Due to language barrier, an interpreter was present during the history-taking and subsequent discussion (and for part of the physical exam) with this patient. 01/21/20  Ashleigh #[id number]  Orpah Cobb, DO PGY-3, [facility] 11/08/2020 1:48 PM

## 2020-11-09 ENCOUNTER — Encounter: Payer: Self-pay | Admitting: Family Medicine

## 2020-11-09 LAB — TSH: TSH: 0.571 u[IU]/mL (ref 0.450–4.500)

## 2020-11-21 NOTE — Progress Notes (Unsigned)
   Subjective:   Patient ID: Abigail Riley    DOB: 08/02/1946, 75 y.o. female   MRN: [id number]  Levette Paulick is a 75 y.o. female with a history of HTN, lumbar strain, osteoporosis, depression, hypercholesterolemia, insomnia, MDD here for depression follow up.  MDD: Patient seen on 11/08/20 for depression symptoms. PHQ-9 score elevated to 17 with positive answer to #9 with passive thoughts of being better of dead. TSH at that time was normal. She was started on Celexa 20mg  QD which she has used before with good success. She also continued Trazodone for sleep. Referral to [facility] behavioral health was placed for therapy resources. She notes that she "feels better than before". She has been taking the medicine as prescribed. The Trazodone 50mg  has helped her sleep. She notes she is now getting 4-5 hours of sleep which is improved from before. She notes that the Behavioral Health center called her but they spoke in [facility] and she couldn't understand them. They never used a . Per chart review, patient noted she was uninterested in therapy. PHQ-9 score today: 24 with zero to question #9. Denies SI/HI.   Review of Systems:  Per HPI.   Objective:   BP 134/80   Pulse 63   Wt 118 lb (53.5 kg)   SpO2 96%   BMI 25.28 kg/m  Vitals and nursing note reviewed.  General: pleasant elderly woman with friend at side, sitting comfortably in exam chair, well nourished, well developed, in no acute distress with non-toxic appearance Resp: breathing comfortably on room air, speaking in full sentences MSK:  gait normal Neuro: Alert and oriented, speech normal Psych:  Cognition and judgment appear intact. Alert, communicative  and cooperative with normal attention span and concentration. No apparent delusions, illusions, hallucinations.   Assessment & Plan:   Moderate episode of recurrent major depressive disorder (HCC) Chronic. Improving per patient, although worsening PHQ-9. No SI/HI. Not  interested in therapy. Tolerating Celexa 20mg  QD and improved sleep with Trazodone.  - Continue Celexa 20mg  QD, refill provided - Can trial increase in Trazodone to 100mg  qHS to help with sleep. If no improvement, go back to 25-50mg  QD.  - Follow up in 1 month for continued monitoring.  Insomnia Chronic. Improving.  - plan as above  No orders of the defined types were placed in this encounter.  Meds ordered this encounter  Medications  . citalopram (CELEXA) 20 MG tablet    Sig: Take 1 tablet (20 mg total) by mouth daily.    Dispense:  30 tablet    Refill:  0   Due to language barrier, an interpreter was present during the history-taking and subsequent discussion (and for part of the physical exam) with this patient. [place]  Nurse, learning disability, DO PGY-3, [facility] 11/22/2020 9:21 AM

## 2020-11-22 ENCOUNTER — Encounter: Payer: Self-pay | Admitting: Family Medicine

## 2020-11-22 ENCOUNTER — Other Ambulatory Visit: Payer: Self-pay

## 2020-11-22 ENCOUNTER — Ambulatory Visit (INDEPENDENT_AMBULATORY_CARE_PROVIDER_SITE_OTHER): Payer: Medicare Other | Admitting: Family Medicine

## 2020-11-22 DIAGNOSIS — F5104 Psychophysiologic insomnia: Secondary | ICD-10-CM

## 2020-11-22 DIAGNOSIS — F331 Major depressive disorder, recurrent, moderate: Secondary | ICD-10-CM | POA: Diagnosis not present

## 2020-11-22 MED ORDER — CITALOPRAM HYDROBROMIDE 20 MG PO TABS: 20.0000 mg | ORAL_TABLET | Freq: Every day | ORAL | 0 refills | Status: DC

## 2020-11-22 NOTE — Assessment & Plan Note (Signed)
Chronic. Improving.  - plan as above

## 2020-11-22 NOTE — Assessment & Plan Note (Signed)
Chronic. Improving per patient, although worsening PHQ-9. No SI/HI. Not interested in therapy. Tolerating Celexa 20mg  QD and improved sleep with Trazodone.  - Continue Celexa 20mg  QD, refill provided - Can trial increase in Trazodone to 100mg  qHS to help with sleep. If no improvement, go back to 25-50mg  QD.  - Follow up in 1 month for continued monitoring.

## 2020-11-22 NOTE — Patient Instructions (Signed)
R?t vui ???c g?p b?n hm nay!  C?m ?n b?n ? l?a ch?n [facility] ?? ch?m [place] s?c kh?e ban ??u cho mnh.  K? ho?ch c?a chng ti cho ngy hm nay l: Ti?p t?c Celexa 20mg  m?i ngy B?n c th? t?ng Trazodone (thu?c ng?) ln hai vin (100mg ) vo ban ?m ?? gip c?i thi?n gi?c ng? c?a b?n nhi?u h?n. N?u cch ny khng hi?u qu?, vui lng quay l?i 1 vin (50mg ) Theo di vo 12/30/19 lc 9:10 sng  L?i chc t?t nh?t,  , DO   It was a pleasure to see you today!  Thank you for choosing [facility] for your primary care.   Our plans for today were:  Continue the Celexa 20mg  daily  You can increase the Trazodone (sleep medicine) to two tablets (100mg ) at night to help improve your sleep more. If this doesn't help, please go back to 1 tablet (50mg )  Follow up on 12/30/19 at 9:10am  Best Wishes,   02/27/20, DO

## 2020-12-28 NOTE — Progress Notes (Signed)
Subjective:   Patient ID: Abigail Riley    DOB: [birth year], 75 y.o. female   MRN: [id number]  Abigail Riley is a 75 y.o. female with a history of HTN, lumbar starin, osteoporosis, depression, HLD, insomnia  here for depression follow up.  MDD: Patient returns today for continued follow up of depression. Currently taking Celexa 20mg  QD and Trazodone qHS for sleep. She notes she is doing much better. She endorses improvement since her last visit. She was told to trial Trazodone 100mg  qHS to help improve sleep. She notes that the trazodone at that high dose made her mouth really dry so shes been doing 50mg  qHS. She is getting 6-7 hours of sleep per night.  PHQ-9 score 2 today. Denies SI/HI. Declines therapy.   Health Maintenance: Health Maintenance Due  Topic  . DEXA SCAN   . COVID-19 Vaccine (3 - Booster for series)   Review of Systems:  Per HPI.   Objective:   BP 138/75   Pulse 76   Ht 4\' 10"  (1.473 m)   Wt 116 lb 9.6 oz (52.9 kg)   SpO2 96%   BMI 24.37 kg/m  Vitals and nursing note reviewed.  General: pleasant elderly woman, sitting comfortably in exam chair, well nourished, well developed, in no acute distress with non-toxic appearance Resp: breathing comfortably on room air, speaking in full sentences Skin: warm, dry MSK:  gait normal Neuro: Alert and oriented, speech normal  Depression screen [facility] 2/9 12/29/2020 11/22/2020 11/22/2020  Decreased Interest 2 - 3  Down, Depressed, Hopeless 0 - 3  PHQ - 2 Score 2 - 6  Altered sleeping 0 - 3  Tired, decreased energy 0 - 3  Change in appetite 0 - 3  Feeling bad or failure about yourself  0 - 3  Trouble concentrating 0 - 3  Moving slowly or fidgety/restless 0 - 3  Suicidal thoughts 0 0 3  PHQ-9 Score 2 - 27     Assessment & Plan:   Moderate episode of recurrent major depressive disorder (HCC) Chronic. Improved. PHQ-9 score improved from 27 to 2. Tolerating Celexa 20mg  QD and Trazodone qHS. Denies SI/HI. - Congratulated  patient on improvement in her symptoms - Follow up in 3 months for continued monitoring, sooner if worsening  Insomnia Chronic, improved. Tolerating Trazodone 50mg  qHS PRN. - continue current meds  Osteoporosis Has not been taking Calcium or Vitamin D. Reiterated importance of taking daily.  Rx's provided to pharmacy.   Health Maintenance; - patient is up to date on COVID vaccinations - she plans to bring card in at next visit to update HM  No orders of the defined types were placed in this encounter.  Meds ordered this encounter  Medications  . traZODone (DESYREL) 50 MG tablet    Sig: Take 0.5-1 tablets (25-50 mg total) by mouth at bedtime as needed for sleep.    Dispense:  90 tablet    Refill:  2  . citalopram (CELEXA) 20 MG tablet    Sig: Take 1 tablet (20 mg total) by mouth daily.    Dispense:  90 tablet    Refill:  2  . Calcium Carbonate 500 MG CHEW    Sig: Chew 2 tablets (1,000 mg total) by mouth daily.    Dispense:  180 tablet    Refill:  2  . cholecalciferol (VITAMIN D) 25 MCG (1000 UNIT) tablet    Sig: Take 1 tablet (1,000 Units total) by mouth daily.    Dispense:  180 tablet    Refill:  11    Follow up: 3 months for annual exam and depression follow up  Due to language barrier, an interpreter was present during the history-taking and subsequent discussion (and for part of the physical exam) with this patient. In-person interpreter:   [address], [place] PGY-3, [facility] 12/29/2020 1:58 PM

## 2020-12-29 ENCOUNTER — Ambulatory Visit (INDEPENDENT_AMBULATORY_CARE_PROVIDER_SITE_OTHER): Payer: Medicare Other | Admitting: Family Medicine

## 2020-12-29 ENCOUNTER — Other Ambulatory Visit: Payer: Self-pay

## 2020-12-29 VITALS — BP 138/75 | HR 76 | Ht <= 58 in | Wt 116.6 lb

## 2020-12-29 DIAGNOSIS — F331 Major depressive disorder, recurrent, moderate: Secondary | ICD-10-CM | POA: Diagnosis present

## 2020-12-29 DIAGNOSIS — F5105 Insomnia due to other mental disorder: Secondary | ICD-10-CM

## 2020-12-29 DIAGNOSIS — M810 Age-related osteoporosis without current pathological fracture: Secondary | ICD-10-CM | POA: Diagnosis not present

## 2020-12-29 DIAGNOSIS — F99 Mental disorder, not otherwise specified: Secondary | ICD-10-CM

## 2020-12-29 MED ORDER — CALCIUM CARBONATE 500 MG PO CHEW: 1000.0000 mg | CHEWABLE_TABLET | Freq: Every day | ORAL | 2 refills | Status: AC

## 2020-12-29 MED ORDER — VITAMIN D3 25 MCG (1000 UNIT) PO TABS: 1000.0000 [IU] | ORAL_TABLET | Freq: Every day | ORAL | 11 refills | Status: AC

## 2020-12-29 MED ORDER — TRAZODONE HCL 50 MG PO TABS: 25.0000 mg | ORAL_TABLET | Freq: Every evening | ORAL | 2 refills | Status: DC | PRN

## 2020-12-29 MED ORDER — CITALOPRAM HYDROBROMIDE 20 MG PO TABS: 20.0000 mg | ORAL_TABLET | Freq: Every day | ORAL | 2 refills | Status: DC

## 2020-12-29 NOTE — Assessment & Plan Note (Addendum)
Has not been taking Calcium or Vitamin D. Reiterated importance of taking daily.  Rx's provided to pharmacy.  Did not receive recommended 5 years of Alendronate.  Will discuss restarting at follow up visit. Would benefit from an additional 2-3 years. Needs repeat DEXA scan

## 2020-12-29 NOTE — Assessment & Plan Note (Signed)
Chronic. Improved. PHQ-9 score improved from 27 to 2. Tolerating Celexa 20mg  QD and Trazodone qHS. Denies SI/HI. - Congratulated patient on improvement in her symptoms - Follow up in 3 months for continued monitoring, sooner if worsening

## 2020-12-29 NOTE — Assessment & Plan Note (Signed)
Chronic, improved. Tolerating Trazodone 50mg  qHS PRN. - continue current meds

## 2020-12-29 NOTE — Patient Instructions (Addendum)
R?t vui ???c g?p b?n hm nay!  C?m ?n b?n ? l?a ch?n [facility] ?? ch?m [place] s?c kh?e ban ??u cho mnh.  K? ho?ch c?a chng ti cho ngy hm nay l: Ti r?t vui v b?n ?ang c?m th?y t?t h?n. Ti?p t?c Celexa 20mg  m?i ngy v Trazodone 0,5 ??n 1 vin m?i ?m cho gi?c ng? Theo di trong 3 thng cho k? ki?m tra hng n?m c?a b?n.   B?n nn tr? l?i phng khm c?a chng ti sau 3 thng ?? ki?m tra hng n?m.  L?i chc t?t nh?t,  , DO   It was a pleasure to see you today!  Thank you for choosing [facility] for your primary care.   Our plans for today were:  I am so glad you are feeling better.  Continue the Celexa 20mg  daily and the Trazodone 0.5 to 1 pill a night for sleep   Follow up in 3 months for your annual exam.    You should return to our clinic in 3 months for annual exam.   Best Wishes,   [employer], DO

## 2021-04-19 ENCOUNTER — Other Ambulatory Visit: Payer: Self-pay

## 2021-04-20 MED ORDER — ATORVASTATIN CALCIUM 40 MG PO TABS: 1.0000 | ORAL_TABLET | Freq: Every day | ORAL | 0 refills | Status: DC

## 2021-04-20 MED ORDER — LISINOPRIL 40 MG PO TABS: 40.0000 mg | ORAL_TABLET | Freq: Every day | ORAL | 0 refills | Status: DC

## 2021-04-20 NOTE — Telephone Encounter (Signed)
It has been >1 year since evaluated for chronic diseases. Recommended follow up appointment at earliest convenience. I have provided 3 month supply to allow time to schedule.

## 2021-05-13 ENCOUNTER — Other Ambulatory Visit: Payer: Self-pay | Admitting: Family Medicine

## 2021-05-13 DIAGNOSIS — F331 Major depressive disorder, recurrent, moderate: Secondary | ICD-10-CM

## 2021-07-21 ENCOUNTER — Other Ambulatory Visit: Payer: Self-pay | Admitting: Family Medicine

## 2021-11-26 ENCOUNTER — Other Ambulatory Visit: Payer: Self-pay | Admitting: Family Medicine

## 2021-11-27 ENCOUNTER — Other Ambulatory Visit: Payer: Self-pay

## 2021-11-28 NOTE — Telephone Encounter (Signed)
Attempted to reach patent through interpreter Pam [phone]. No answer. Vociemail not set up. Unable to LVM. Will try later. Aquilla Solian, CMA

## 2022-01-04 NOTE — Telephone Encounter (Signed)
Attempted to reach patient through interpreter Terrence Dupont [phone]. No answer. Vociemail not set. Salvatore Marvel, CMA

## 2022-02-15 ENCOUNTER — Other Ambulatory Visit: Payer: Self-pay

## 2022-02-15 DIAGNOSIS — F331 Major depressive disorder, recurrent, moderate: Secondary | ICD-10-CM

## 2022-02-15 DIAGNOSIS — F5105 Insomnia due to other mental disorder: Secondary | ICD-10-CM

## 2022-02-15 MED ORDER — TRAZODONE HCL 50 MG PO TABS: 25.0000 mg | ORAL_TABLET | Freq: Every evening | ORAL | 0 refills | Status: DC | PRN

## 2022-04-04 ENCOUNTER — Other Ambulatory Visit: Payer: Self-pay | Admitting: Family Medicine

## 2022-04-04 DIAGNOSIS — F5105 Insomnia due to other mental disorder: Secondary | ICD-10-CM

## 2022-04-04 DIAGNOSIS — F99 Mental disorder, not otherwise specified: Secondary | ICD-10-CM

## 2022-04-04 DIAGNOSIS — F331 Major depressive disorder, recurrent, moderate: Secondary | ICD-10-CM

## 2022-05-27 IMAGING — CR DG LUMBAR SPINE COMPLETE 4+V
5 series · 5 of 5 positions shown · non-contrast
Comparison: None.

CLINICAL DATA: Low back pain, primarily right-sided

EXAM:
LUMBAR SPINE - COMPLETE 4+ VIEW

[t lumbar spine ap]
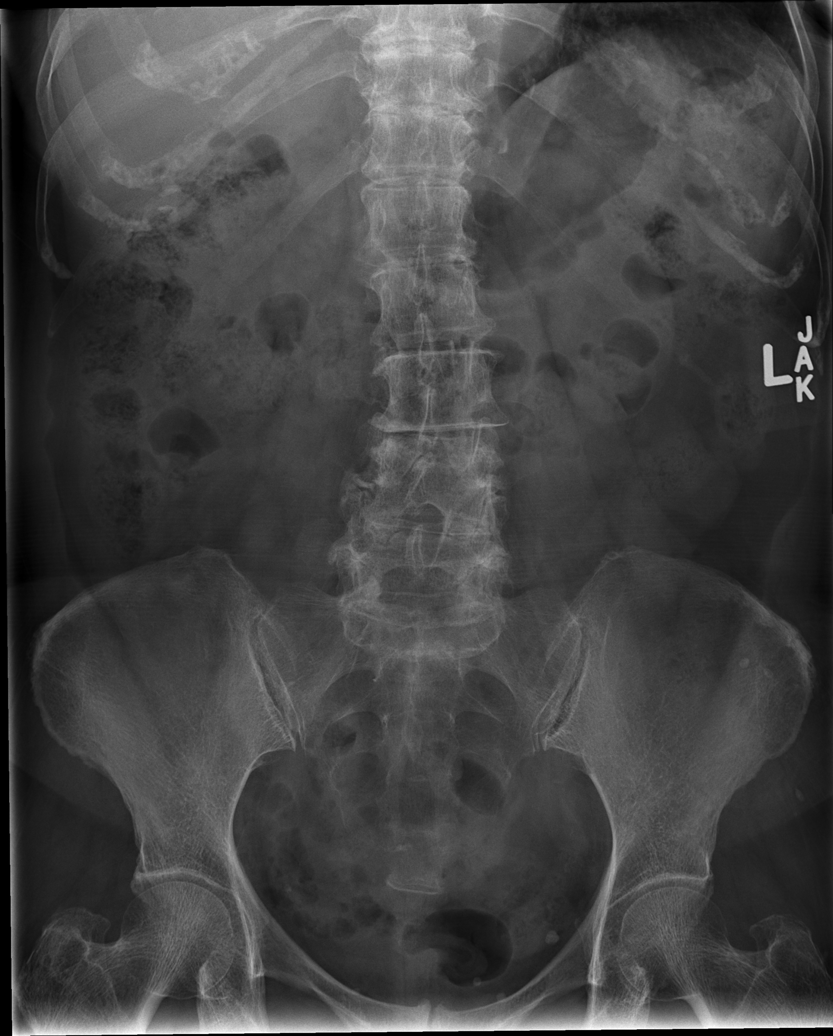

[t lumbar spine obl (1 of 2)]
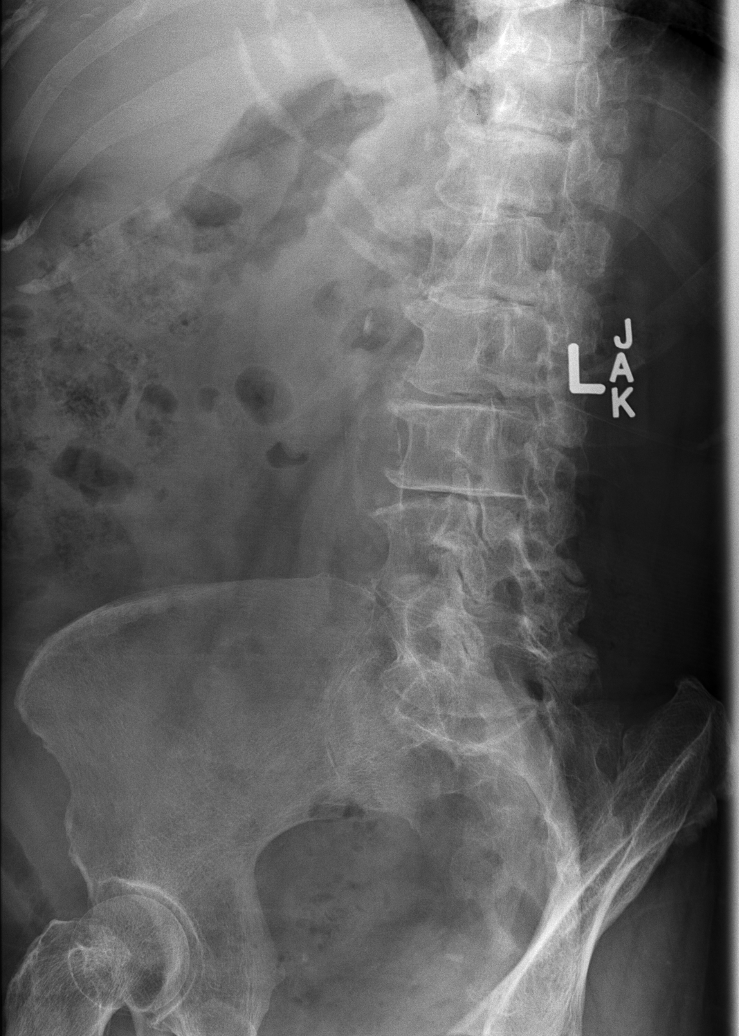

[t lumbar spine obl (2 of 2)]
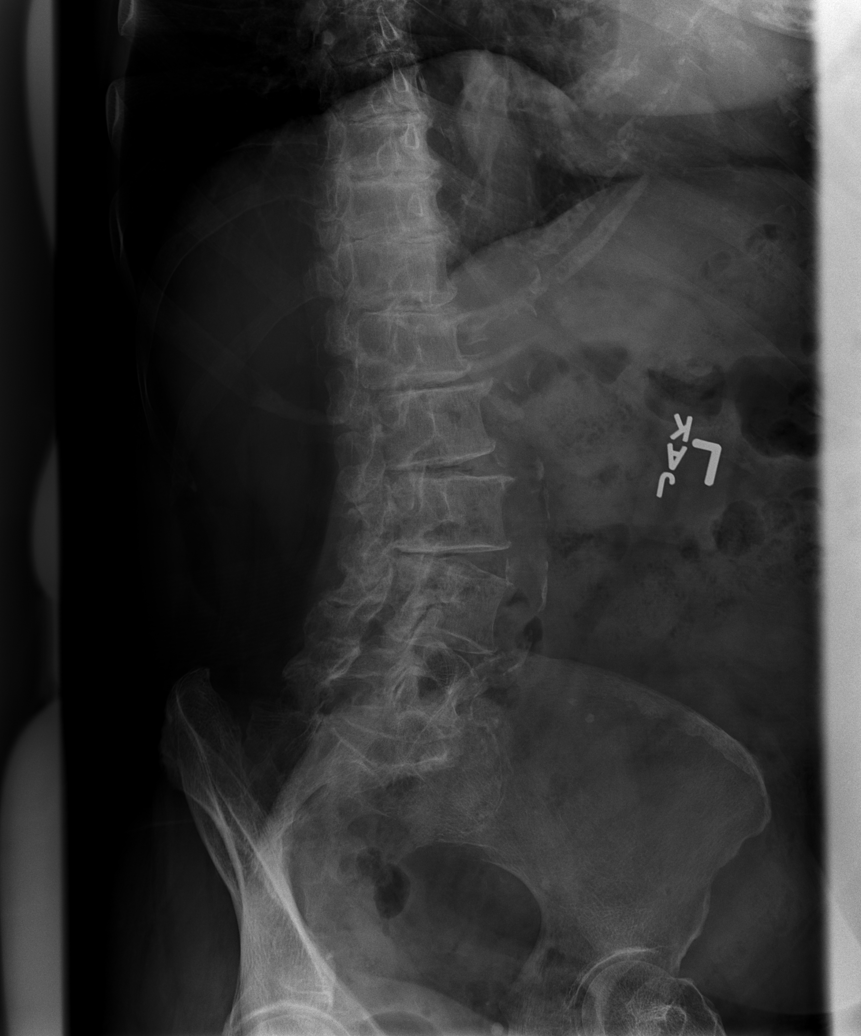

[t lumbar spine lat]
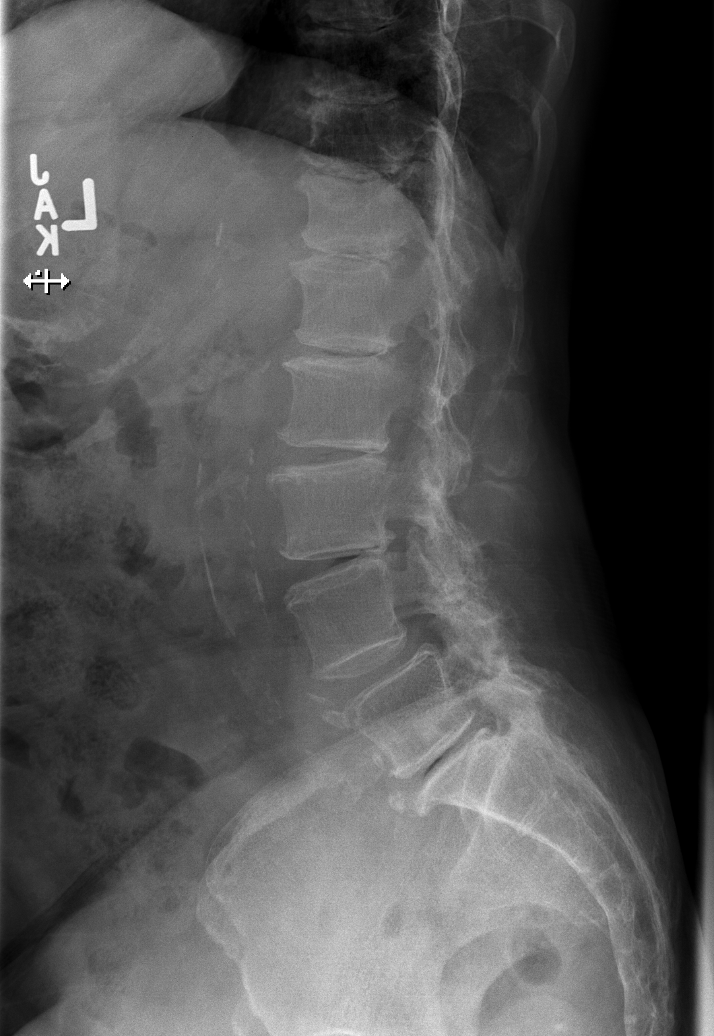

[t lumbar l-5 s-1 spot]
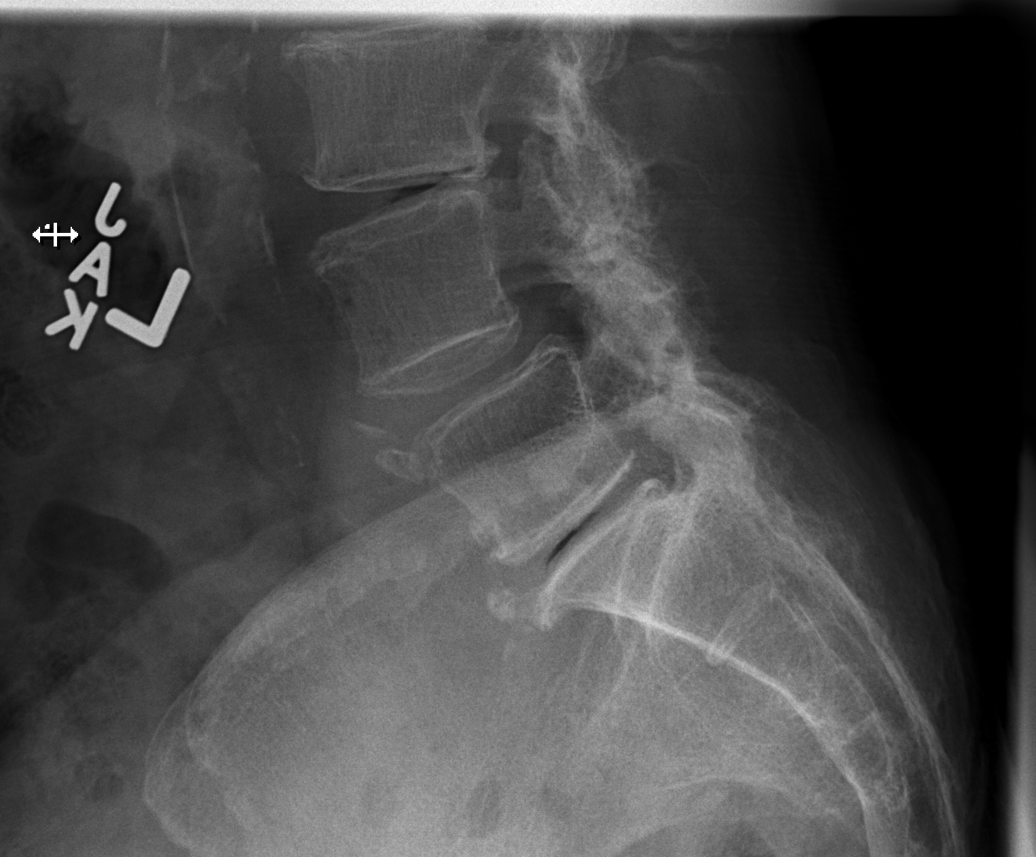

[5 of 5 positions shown; findings below may reference images not displayed]

FINDINGS: Frontal, lateral, spot lumbosacral lateral, and bilateral oblique
views were obtained. There are 5 non-rib-bearing lumbar type
vertebral bodies. There is lumbar levoscoliosis with mild rotatory
component. There is no appreciable fracture. There is 5 mm of
anterolisthesis of L4 on L5. No other spondylolisthesis. There is
relative disc space narrowing at all levels with vacuum phenomenon
at all levels except for L4-5. There is facet osteoarthritic change
at all levels bilaterally. There is aortic atherosclerosis.
IMPRESSION: Osteoarthritic change to varying degrees at all levels. 5 mm of
anterolisthesis of L4 on L5 is felt to be due to underlying
spondylosis. No other spondylolisthesis. No fracture. Mild lower
lumbar levoscoliosis.

Aortic Atherosclerosis ([id number]-708.8).

## 2022-06-17 ENCOUNTER — Other Ambulatory Visit: Payer: Self-pay

## 2022-06-17 DIAGNOSIS — F331 Major depressive disorder, recurrent, moderate: Secondary | ICD-10-CM

## 2022-06-17 MED ORDER — CITALOPRAM HYDROBROMIDE 20 MG PO TABS: 20.0000 mg | ORAL_TABLET | Freq: Every day | ORAL | 0 refills | Status: DC

## 2022-08-09 ENCOUNTER — Other Ambulatory Visit: Payer: Self-pay | Admitting: Family Medicine

## 2022-08-09 DIAGNOSIS — F331 Major depressive disorder, recurrent, moderate: Secondary | ICD-10-CM

## 2022-11-28 ENCOUNTER — Other Ambulatory Visit: Payer: Self-pay | Admitting: Family Medicine

## 2022-11-28 DIAGNOSIS — F331 Major depressive disorder, recurrent, moderate: Secondary | ICD-10-CM

## 2022-12-25 ENCOUNTER — Other Ambulatory Visit: Payer: Self-pay | Admitting: Registered Nurse

## 2022-12-25 DIAGNOSIS — M810 Age-related osteoporosis without current pathological fracture: Secondary | ICD-10-CM

## 2023-01-06 ENCOUNTER — Encounter: Payer: Self-pay | Admitting: Physician Assistant

## 2023-02-06 ENCOUNTER — Ambulatory Visit: Payer: Medicare Other | Admitting: Physician Assistant

## 2023-03-20 ENCOUNTER — Encounter: Payer: Self-pay | Admitting: Nurse Practitioner

## 2023-03-20 ENCOUNTER — Ambulatory Visit (INDEPENDENT_AMBULATORY_CARE_PROVIDER_SITE_OTHER): Payer: 59 | Admitting: Nurse Practitioner

## 2023-03-20 VITALS — BP 108/76 | HR 84 | Ht 61.0 in | Wt 127.0 lb

## 2023-03-20 DIAGNOSIS — R195 Other fecal abnormalities: Secondary | ICD-10-CM | POA: Diagnosis not present

## 2023-03-20 MED ORDER — NA SULFATE-K SULFATE-MG SULF 17.5-3.13-1.6 GM/177ML PO SOLN: 1.0000 | Freq: Once | ORAL | 0 refills | Status: AC

## 2023-03-20 NOTE — Progress Notes (Signed)
     Assessment / Plan   Primary GI: New - Abigail Amass, MD  77 y.o. yo female with a past medical history consisting of, but not limited to osteoporosis, HTN  Positive FIT. No overt GI bleeding.  -Schedule for a colonoscopy. The risks and benefits of colonoscopy with possible polypectomy / biopsies were discussed and the patient agrees to proceed.     History of Present Illness   Chief Complaint:  positive FIT  Patient referred by PCP for a positive FIT. No records available for review.   Non-English speaking. Here with an Equities trader.   Patient hasn't seen any blood in stool. She had a colonoscopy 7-8 years ago  ( doesn't know where)  Reportedly a small polyp was removed, told to have repeat exam in 10 years. Patient has no abdominal pain . BMs normal. She hasn't seen any blood in her stools. Weight is stable. No FMH of colon cancer.    Previous Endoscopies / Labs /  Imaging  No recent labs or imaging in Epic   Past Medical History:  Diagnosis Date   Essential hypertension    Language barrier affecting health care    Osteoporosis    by DEXA,therapy started fall 2019 (would trial off after 2024).    History reviewed. No pertinent surgical history. History reviewed. No pertinent family history. Social History   Tobacco Use   Smoking status: Never   Smokeless tobacco: Never  Substance Use Topics   Alcohol use: Never   Drug use: Never   Current Outpatient Medications  Medication Sig Dispense Refill   atorvastatin (LIPITOR) 40 MG tablet TAKE 1 TABLET(40 MG) BY MOUTH DAILY 90 tablet 0   Calcium Carbonate 500 MG CHEW Chew 2 tablets (1,000 mg total) by mouth daily. 180 tablet 2   cholecalciferol (VITAMIN D) 25 MCG (1000 UNIT) tablet Take 1 tablet (1,000 Units total) by mouth daily. 180 tablet 11   citalopram (CELEXA) 20 MG tablet TAKE 1 TABLET(20 MG) BY MOUTH DAILY 30 tablet 0   lisinopril (ZESTRIL) 40 MG tablet TAKE 1 TABLET(40 MG) BY MOUTH DAILY 90 tablet 0   No  current facility-administered medications for this visit.   No Known Allergies   Review of Systems: All systems reviewed and negative except where noted in HPI.   Wt Readings from Last 3 Encounters:  03/20/23 127 lb (57.6 kg)  12/29/20 116 lb 9.6 oz (52.9 kg)  11/22/20 118 lb (53.5 kg)    Physical Exam:  BP 108/76   Pulse 84   Ht 5\' 1"  (1.549 m)   Wt 127 lb (57.6 kg)   BMI 24.00 kg/m  Constitutional:  Pleasant, generally well appearing female in no acute distress. Psychiatric:  Normal mood and affect. Behavior is normal. EENT: Pupils normal.  Conjunctivae are normal. No scleral icterus. Neck supple.  Cardiovascular: Normal rate, regular rhythm.  Pulmonary/chest: Effort normal and breath sounds normal. No wheezing, rales or rhonchi. Abdominal: Soft, nondistended, nontender. Bowel sounds active throughout. There are no masses palpable. No hepatomegaly. Neurological: Alert and oriented to person place and time.   Willette Cluster, NP  03/20/2023, 11:38 AM  Cc:  Referring Provider Littie Deeds, MD

## 2023-03-20 NOTE — Patient Instructions (Addendum)
  B?n ? ???c ln l?ch ?? n?i soi. Vui lng lm theo h??ng d?n b?ng v?n b?n ???c cung c?p cho b?n trong chuy?n th?m ngy hm nay. Vui lng nh?n ?? dng chu?n b? c?a b?n t?i hi?u thu?c trong vng 1-3 ngy t?i. N?u b?n s? d?ng ?ng ht (th?m ch ch? khi c?n thi?t), vui lng mang theo bn mnh vo ngy lm th? thu?t.  Do nh?ng thay ??i g?n ?y trong lu?t ch?m [place] s?c kh?e, b?n c th? xem k?t qu? nghin c?u hnh ?nh v xt nghi?m c?a mnh trn MyChart tr??c khi nh cung c?p c?a b?n c c? h?i xem xt chng. Chng ti hi?u r?ng trong m?t s? tr??ng h?p c th? c k?t qu? khi?n b?n b?i r?i ho?c lo l?ng. Khng ph?i t?t c? cc k?t qu? xt nghi?m ??u ???c tr? v? trong cng m?t khung th?i gian v nh cung c?p c th? ??i nhi?u k?t qu? ?? di?n gi?i cc k?t qu? khc. Vui lng cho chng ti 48 gi? ?? nh cung c?p c?a b?n xem xt k? l??ng t?t c? cc k?t qu? tr??c khi lin h? v?i v?n phng ?? lm r k?t qu? c?a b?n.  Ti ?nh gi cao c? h?i ???c ch?m [place] cho b?n  C?m ?n  [place]

## 2023-03-24 NOTE — Progress Notes (Signed)
Agree with the assessment and plan as outlined by Paula Guenther, NP.  Denim Kalmbach E. Ismerai Bin, MD [facility]  

## 2023-04-24 ENCOUNTER — Encounter: Payer: Self-pay | Admitting: Gastroenterology

## 2023-04-24 ENCOUNTER — Ambulatory Visit (AMBULATORY_SURGERY_CENTER): Payer: 59 | Admitting: Gastroenterology

## 2023-04-24 VITALS — BP 136/67 | HR 61 | Temp 98.9°F | Resp 14 | Ht 61.0 in | Wt 127.0 lb

## 2023-04-24 DIAGNOSIS — R195 Other fecal abnormalities: Secondary | ICD-10-CM | POA: Diagnosis not present

## 2023-04-24 DIAGNOSIS — Z1211 Encounter for screening for malignant neoplasm of colon: Secondary | ICD-10-CM | POA: Diagnosis not present

## 2023-04-24 MED ORDER — SODIUM CHLORIDE 0.9 % IV SOLN: 500.0000 mL | Freq: Once | INTRAVENOUS | Status: DC

## 2023-04-24 NOTE — Op Note (Signed)
[facility] Patient Name: Abigail Riley Procedure Date: 04/24/2023 7:59 AM MRN: [id number] Endoscopist: Lorin Picket E. Tomasa Rand , MD, [id number] Age: 77 Referring MD:  Date of Birth: 1946/11/03 Gender: Female Account #: [id number] Procedure:                Colonoscopy Indications:              Positive fecal immunochemical test Medicines:                Monitored Anesthesia Care Procedure:                Pre-Anesthesia Assessment:                           - Prior to the procedure, a History and Physical                            was performed, and patient medications and                            allergies were reviewed. The patient's tolerance of                            previous anesthesia was also reviewed. The risks                            and benefits of the procedure and the sedation                            options and risks were discussed with the patient.                            All questions were answered, and informed consent                            was obtained. Prior Anticoagulants: The patient has                            taken no anticoagulant or antiplatelet agents. ASA                            Grade Assessment: II - A patient with mild systemic                            disease. After reviewing the risks and benefits,                            the patient was deemed in satisfactory condition to                            undergo the procedure.                           After obtaining informed consent, the colonoscope  was passed under direct vision. Throughout the                            procedure, the patient's blood pressure, pulse, and                            oxygen saturations were monitored continuously. The                            Olympus PCF-H190DL (#[id number]) Colonoscope was                            introduced through the anus and advanced to the the                            terminal ileum, with  identification of the                            appendiceal orifice and IC valve. The colonoscopy                            was performed without difficulty. The patient                            tolerated the procedure well. The quality of the                            bowel preparation was good. The terminal ileum,                            ileocecal valve, appendiceal orifice, and rectum                            were photographed. The bowel preparation used was                            SUPREP via split dose instruction. Scope In: 8:06:11 AM Scope Out: 8:20:39 AM Scope Withdrawal Time: 0 hours 9 minutes 25 seconds  Total Procedure Duration: 0 hours 14 minutes 28 seconds  Findings:                 The perianal and digital rectal examinations were                            normal. Pertinent negatives include normal                            sphincter tone and no palpable rectal lesions.                           Multiple medium-mouthed and small-mouthed                            diverticula were found in the sigmoid colon.  The exam was otherwise normal throughout the                            examined colon.                           The terminal ileum appeared normal.                           The retroflexed view of the distal rectum and anal                            verge was normal and showed no anal or rectal                            abnormalities. Complications:            No immediate complications. Estimated Blood Loss:     Estimated blood loss: none. Impression:               - Moderate diverticulosis in the sigmoid colon.                           - The examined portion of the ileum was normal.                           - The distal rectum and anal verge are normal on                            retroflexion view.                           - No specimens collected. Recommendation:           - Patient has a contact number available for                             emergencies. The signs and symptoms of potential                            delayed complications were discussed with the                            patient. Return to normal activities tomorrow.                            Written discharge instructions were provided to the                            patient.                           - Resume previous diet.                           - Continue present medications.                           -  Given patient's age and lack of polyps, recommend                            against any further colon cancer screening. [name] E. Tomasa Rand, MD 04/24/2023 8:27:56 AM This report has been signed electronically.

## 2023-04-24 NOTE — Progress Notes (Signed)
[facility] History and Physical   Primary Care Physician:  Littie Deeds, MD   Reason for Procedure:   Positive FIT   Plan:    Colonoscopy     HPI: Abigail Riley is a 77 y.o. female undergoing colonoscopy following a positive FIT.  She has no family history of colon cancer and no chronic GI symptoms.  She reportedly had a colonoscopy 7-8 years ago with a small polyp, recommended to repeat in 10 years.   Past Medical History:  Diagnosis Date   Essential hypertension    Language barrier affecting health care    Osteoporosis    by DEXA,therapy started fall 2019 (would trial off after 2024).     Past Surgical History:  Procedure Laterality Date   COLONOSCOPY      Prior to Admission medications   Medication Sig Start Date End Date Taking? Authorizing Provider  alendronate (FOSAMAX) 70 MG tablet Take 70 mg by mouth once a week. 03/20/23  Yes [provider]  atorvastatin (LIPITOR) 40 MG tablet TAKE 1 TABLET(40 MG) BY MOUTH DAILY 11/26/21  Yes Welborn, Ryan, DO  Calcium Carbonate 500 MG CHEW Chew 2 tablets (1,000 mg total) by mouth daily. 12/29/20  Yes Mullis, Kiersten P, DO  cholecalciferol (VITAMIN D) 25 MCG (1000 UNIT) tablet Take 1 tablet (1,000 Units total) by mouth daily. 12/29/20  Yes Mullis, Kiersten P, DO  citalopram (CELEXA) 20 MG tablet TAKE 1 TABLET(20 MG) BY MOUTH DAILY 11/28/22  Yes Littie Deeds, MD  diclofenac (VOLTAREN) 75 MG EC tablet Take 75 mg by mouth 2 (two) times daily as needed. 03/20/23  Yes [provider]  lisinopril (ZESTRIL) 40 MG tablet TAKE 1 TABLET(40 MG) BY MOUTH DAILY 11/26/21  Yes Jackelyn Poling, DO    Current Outpatient Medications  Medication Sig Dispense Refill   alendronate (FOSAMAX) 70 MG tablet Take 70 mg by mouth once a week.     atorvastatin (LIPITOR) 40 MG tablet TAKE 1 TABLET(40 MG) BY MOUTH DAILY 90 tablet 0   Calcium Carbonate 500 MG CHEW Chew 2 tablets (1,000 mg total) by mouth daily. 180 tablet 2   cholecalciferol  (VITAMIN D) 25 MCG (1000 UNIT) tablet Take 1 tablet (1,000 Units total) by mouth daily. 180 tablet 11   citalopram (CELEXA) 20 MG tablet TAKE 1 TABLET(20 MG) BY MOUTH DAILY 30 tablet 0   diclofenac (VOLTAREN) 75 MG EC tablet Take 75 mg by mouth 2 (two) times daily as needed.     lisinopril (ZESTRIL) 40 MG tablet TAKE 1 TABLET(40 MG) BY MOUTH DAILY 90 tablet 0   Current Facility-Administered Medications  Medication Dose Route Frequency Provider Last Rate Last Admin   0.9 %  sodium chloride infusion  500 mL Intravenous Once Jenel Lucks, MD        Allergies as of 04/24/2023   (No Known Allergies)    Family History  Problem Relation Age of Onset   Colon cancer Neg Hx    Esophageal cancer Neg Hx    Rectal cancer Neg Hx    Stomach cancer Neg Hx     Social History   Socioeconomic History   Marital status: Married    Spouse name: Not on file   Number of children: Not on file   Years of education: Not on file   Highest education level: Not on file  Occupational History   Not on file  Tobacco Use   Smoking status: Never   Smokeless tobacco: Never  Vaping Use  Vaping Use: Never used  Substance and Sexual Activity   Alcohol use: Never   Drug use: Never   Sexual activity: Not on file  Other Topics Concern   Not on file  Social History Narrative   [place]cannot speak [place]   Lives with nephew in [place]   Social Determinants of Health   Financial Resource Strain: Not on file  Food Insecurity: Not on file  Transportation Needs: Not on file  Physical Activity: Not on file  Stress: Not on file  Social Connections: Not on file  Intimate Partner Violence: Not on file    Review of Systems:  All other review of systems negative except as mentioned in the HPI.  Physical Exam: Vital signs BP (!) 131/91   Pulse 84   Temp 98.9 F (37.2 C)   Ht 5\' 1"  (1.549 m)   Wt 127 lb (57.6 kg)   SpO2 93%   BMI 24.00 kg/m   General:   Alert,  Well-developed,  well-nourished, pleasant and cooperative in NAD Airway:  Mallampati 2 Lungs:  Clear throughout to auscultation.   Heart:  Regular rate and rhythm; no murmurs, clicks, rubs,  or gallops. Abdomen:  Soft, nontender and nondistended. Normal bowel sounds.   Neuro/Psych:  Normal mood and affect. A and O x 3   Abigail Riley E. Tomasa Rand, MD [facility]

## 2023-04-24 NOTE — Progress Notes (Signed)
Pt awake, alert and oriented. VSS. Airway intact. SBAR complete to RN. All questions answered.   

## 2023-04-24 NOTE — Patient Instructions (Signed)
Please read handouts provided. Continue present medications. Resume previous diet.   HM NAY QU V? ? TH?C HI?N TH? THU?T N?I SOI T?I TRUNG TM N?I SOI [place]: Vui lng xem bo co th? thu?t ? ???c g?i cho qu v?, n?u qu v? c b?t k? th?c m?c g trong su?t qu trnh th?m khm. N?u bo co th? thu?t khng th? gi?i ?p th?c m?c c?a qu v?, vui lng g?i cho bc s? chuyn khoa tiu ha c?a qu v? ?? ???c gi?i ?p. N?u qu v? ? yu c?u khng cung c?p thng tin chi ti?t v? k?t qu? th? thu?t cho ??i tc ch?m [place] c?a qu v?, th bo co th? thu?t s? ???c g?i trong m?t phong b ???c dn kn ?? qu v? xem khi thu?n ti?n.   QU V? C TH?: C?m gic ch??ng b?ng. Trung ti?n nhi?u h?n bnh th??ng. ?i b? c th? gip ??y ra ngoi khng kh ?i vo ???ng tiu ha trong khi th?c hi?n th? thu?t v gi?m ch??ng b?ng. N?u qu v? ti?n hnh n?i soi d??i (nh? n?i soi ??i trng ho?c soi k?t trng xch-ma b?ng ?ng m?m), qu v? c th? th?y cc ch?m mu ? phn ho?c trn gi?y v? sinh. N?u qu v? ? lm s?ch ??i trng ?? th?c hi?n th? thu?t, qu v? c th? khng ?i ??i ti?n nh? bnh th??ng trong vi ngy.  Vui Lng L?u : Qu v? c th? b? kch ?ng v ngh?t m?i ho?c ch?y n??c m?i. Tnh tr?ng ny l do ?nh h??ng c?a vi?c th? bnh oxy trong qu trnh th?c hi?n th? thu?t. Qu v? khng c?n lo l?ng, tnh tr?ng ny s? bi?n m?t sau m?t ho?c vi ngy.   CC TRI?U CH?NG C?N BO CO NGAY  Sau khi th?c hi?n n?i soi d??i (n?i soi ??i trng ho?c soi k?t trng xch-ma b?ng ?ng m?m): Phn c nhi?u mu ?au b?ng d? d?i ho?c ngy cng t?ng Xu?t hi?n v?t s?ng b?ng m?i, c?p tnh S?t t? 100F tr? ln   Sau khi th?c hi?n n?i soi trn (EGD)  Nn ra mu ho?c ch?t mu c ph s?m Xu?t hi?n c?n ?au ng?c ho?c ?au d??i x??ng b? vai m?i Nu?t ?au ho?c kh nu?t M?i b? kh th? S?t t? 100F tr? ln Phn ?en nh? m?c  ??i v?i cc v?n ?? kh?n c?p ho?c c?p c?u, qu v? c th? lin h? bc s? chuyn khoa tiu ha b?t k? lc no b?ng cch g?i ??n s? [phone].    CH? ?? ?N U?NG: Chng ti [employer] v? tr??c tin nn ?n nh?, nh?ng sau ? qu v? c th? ?n theo ch? ?? bnh th??ng. U?ng nhi?u n??c nh?ng ph?i trnh ?? u?ng c c?n trong 24 gi?.  HO?T ??NG: Qu v? c?n ln k? ho?ch ?? ngh? ng?i trong ngy hm nay v KHNG NN LI XE ho?c s? d?ng my mc n?ng cho ??n ngy mai (do tc d?ng c?a thu?c an th?n s? d?ng trong th? thu?t).   THEO DI: Nhn vin c?a chng ti s? g?i cho qu v? theo s? ?i?n tho?i trong b?nh n vo ngy lm vi?c ti?p theo sau ngy th?c hi?n th? thu?t ?? ki?m tra tnh tr?ng c?a qu v? v gi?i ?p cc cu h?i ho?c th?c m?c c?a qu v? v? thng tin m qu v? ???c cung c?p sau khi th?c hi?n th? thu?t. N?u chng ti khng lin l?c ???c v?i qu v?, chng ti s? ??  l?i tin nh?n. Tuy nhin, n?u qu v? c?m th?y kh?e v khng g?p b?t k? s? c? no, qu v? khng c?n g?i l?i cho chng ti. Chng ti s? gi? ??nh r?ng qu v? ? tr? l?i sinh ho?t bnh th??ng v khng g?p b?t k? s? c? no.  N?u qu v? ???c l?y sinh thi?t, chng ti s? lin l?c v?i qu v? qua ?i?n tho?i ho?c th? trong 1-3 tu?n ti?p theo. Vui lng g?i cho chng ti theo s? [phone] n?u qu v? khng nh?n ???c thng tin v? k?t qu? sinh thi?t trong 3 tu?n.  CH? K/B?O M?TLadell Heads v? v/ho?c ??i tc ch?m [place] c?a qu v? ? k vo cc ti li?u s? ???c nh?p vo h? s? y t? ?i?n t? c?a qu v?. Ch? k ny xc nh?n r?ng cc thng tin trn ?y trong B?n Tm T?t Sau Khi Th?m Khm c?a qu v? ? ???c xem xt v hi?u r. Qu v? v/ho?c YOU HAD AN ENDOSCOPIC PROCEDURE TODAY AT THE [facility]:   Refer to the procedure report that was given to you for any specific questions about what was found during the examination.  If the procedure report does not answer your questions, please call your gastroenterologist to clarify.  If you requested that your care partner not be given the details of your procedure findings, then the procedure report has been included in a sealed envelope for you to review at your  convenience later.  YOU SHOULD EXPECT: Some feelings of bloating in the abdomen. Passage of more gas than usual.  Walking can help get rid of the air that was put into your GI tract during the procedure and reduce the bloating. If you had a lower endoscopy (such as a colonoscopy or flexible sigmoidoscopy) you may notice spotting of blood in your stool or on the toilet paper. If you underwent a bowel prep for your procedure, you may not have a normal bowel movement for a few days.  Please Note:  You might notice some irritation and congestion in your nose or some drainage.  This is from the oxygen used during your procedure.  There is no need for concern and it should clear up in a day or so.  SYMPTOMS TO REPORT IMMEDIATELY:  Following lower endoscopy (colonoscopy or flexible sigmoidoscopy):  Excessive amounts of blood in the stool  Significant tenderness or worsening of abdominal pains  Swelling of the abdomen that is new, acute  Fever of 100F or higher  For urgent or emergent issues, a gastroenterologist can be reached at any hour by calling [phone]. Do not use MyChart messaging for urgent concerns.    DIET:  We do recommend a small meal at first, but then you may proceed to your regular diet.  Drink plenty of fluids but you should avoid alcoholic beverages for 24 hours.  ACTIVITY:  You should plan to take it easy for the rest of today and you should NOT DRIVE or use heavy machinery until tomorrow (because of the sedation medicines used during the test).    FOLLOW UP: Our staff will call the number listed on your records the next business day following your procedure.  We will call around 7:15- 8:00 am to check on you and address any questions or concerns that you may have regarding the information given to you following your procedure. If we do not reach you, we will leave a message.     If any biopsies were  taken you will be contacted by phone or by letter within the next 1-3  weeks.  Please call us at [phone] if you have not heard about the biopsies in 3 weeks.    SIGNATURES/CONFIDENTIALITY: You and/or your care partner have signed paperwork which will be entered into your electronic medical record.  These signatures attest to the fact that that the information above on your After Visit Summary has been reviewed and is understood.  Full responsibility of the confidentiality of this discharge information lies with you and/or your care-partner.

## 2023-04-24 NOTE — Progress Notes (Signed)
Interpreter used today at the [facility] for this pt.  Interpreter's name is-Thai Dory Peru and was present throughout admission process.

## 2023-04-25 ENCOUNTER — Telehealth: Payer: Self-pay

## 2023-04-25 NOTE — Telephone Encounter (Signed)
  Follow up Call-     04/24/2023    7:25 AM  Call back number  Post procedure Call Back phone  # [phone]  Permission to leave phone message Yes     Patient questions:  Do you have a fever, pain , or abdominal swelling? No. Pain Score  0 *  Have you tolerated food without any problems? Yes.    Have you been able to return to your normal activities? Yes.    Do you have any questions about your discharge instructions: Diet   No. Medications  No. Follow up visit  No.  Do you have questions or concerns about your Care? No.  Actions: * If pain score is 4 or above: No action needed, pain <4.
